# Patient Record
Sex: Female | Born: 1990
Health system: Southern US, Community
[De-identification: ages and names within clinical notes are randomized; demographics above are authoritative.]

## PROBLEM LIST (undated history)

## (undated) ENCOUNTER — Inpatient Hospital Stay (HOSPITAL_COMMUNITY): Payer: Self-pay

## (undated) DIAGNOSIS — R519 Headache, unspecified: Secondary | ICD-10-CM

## (undated) DIAGNOSIS — R87629 Unspecified abnormal cytological findings in specimens from vagina: Secondary | ICD-10-CM

## (undated) DIAGNOSIS — R51 Headache: Secondary | ICD-10-CM

## (undated) DIAGNOSIS — Z87410 Personal history of cervical dysplasia: Secondary | ICD-10-CM

## (undated) DIAGNOSIS — K219 Gastro-esophageal reflux disease without esophagitis: Secondary | ICD-10-CM

## (undated) DIAGNOSIS — T8339XA Other mechanical complication of intrauterine contraceptive device, initial encounter: Secondary | ICD-10-CM

## (undated) DIAGNOSIS — Z8619 Personal history of other infectious and parasitic diseases: Secondary | ICD-10-CM

## (undated) HISTORY — PX: LEEP: SHX91

## (undated) HISTORY — DX: Unspecified abnormal cytological findings in specimens from vagina: R87.629

## (undated) HISTORY — DX: Headache: R51

## (undated) HISTORY — DX: Headache, unspecified: R51.9

---

## 2012-01-25 ENCOUNTER — Encounter (HOSPITAL_COMMUNITY): Payer: Self-pay | Admitting: Emergency Medicine

## 2012-01-25 ENCOUNTER — Emergency Department (HOSPITAL_COMMUNITY)
Admission: EM | Admit: 2012-01-25 | Discharge: 2012-01-26 | Disposition: A | Discharge status: Home / Self Care | Payer: Self-pay | Attending: Emergency Medicine | Admitting: Emergency Medicine

## 2012-01-25 DIAGNOSIS — R110 Nausea: Secondary | ICD-10-CM | POA: Insufficient documentation

## 2012-01-25 DIAGNOSIS — R109 Unspecified abdominal pain: Secondary | ICD-10-CM | POA: Insufficient documentation

## 2012-01-25 DIAGNOSIS — N898 Other specified noninflammatory disorders of vagina: Secondary | ICD-10-CM | POA: Insufficient documentation

## 2012-01-25 DIAGNOSIS — N939 Abnormal uterine and vaginal bleeding, unspecified: Secondary | ICD-10-CM

## 2012-01-25 DIAGNOSIS — Z3202 Encounter for pregnancy test, result negative: Secondary | ICD-10-CM | POA: Insufficient documentation

## 2012-01-25 DIAGNOSIS — N760 Acute vaginitis: Secondary | ICD-10-CM

## 2012-01-25 LAB — POCT PREGNANCY, URINE: Preg Test, Ur: NEGATIVE

## 2012-01-25 LAB — CBC WITH DIFFERENTIAL/PLATELET
Basophils Absolute: 0 10*3/uL (ref 0.0–0.1)
Basophils Relative: 0 % (ref 0–1)
Eosinophils Absolute: 0.1 10*3/uL (ref 0.0–0.7)
MCH: 30 pg (ref 26.0–34.0)
MCHC: 33.7 g/dL (ref 30.0–36.0)
Monocytes Absolute: 0.5 10*3/uL (ref 0.1–1.0)
Monocytes Relative: 8 % (ref 3–12)
Neutro Abs: 4.3 10*3/uL (ref 1.7–7.7)
Neutrophils Relative %: 65 % (ref 43–77)
RDW: 12.6 % (ref 11.5–15.5)

## 2012-01-25 LAB — WET PREP, GENITAL: Yeast Wet Prep HPF POC: NONE SEEN

## 2012-01-25 LAB — BASIC METABOLIC PANEL
BUN: 15 mg/dL (ref 6–23)
Chloride: 100 mEq/L (ref 96–112)
Creatinine, Ser: 0.75 mg/dL (ref 0.50–1.10)
GFR calc Af Amer: >90 mL/min (ref >90)
GFR calc non Af Amer: >90 mL/min (ref >90)
Potassium: 3.7 mEq/L (ref 3.5–5.1)

## 2012-01-25 LAB — URINALYSIS, ROUTINE W REFLEX MICROSCOPIC
Ketones, ur: 15 mg/dL — AB
Leukocytes, UA: NEGATIVE
Nitrite: NEGATIVE
Protein, ur: NEGATIVE mg/dL
Urobilinogen, UA: 1 mg/dL (ref 0.0–1.0)

## 2012-01-25 LAB — HCG, SERUM, QUALITATIVE: Preg, Serum: NEGATIVE

## 2012-01-25 NOTE — ED Notes (Addendum)
Pt presents with vaginal bleeding for 2 days, describes it as reddish brown in color.  Pt states she took 3 positive pregnancy tests at home last week.  Pt also complaining of lower back pain, denies any abdominal tenderness or pain.  Pt in NAD at this time.  Urine collected for testing.

## 2012-01-25 NOTE — ED Provider Notes (Signed)
History     CSN: [id number]  Arrival date & time 01/25/12  2026   First MD Initiated Contact with Patient 01/25/12 2039      Chief Complaint  Patient presents with  . Vaginal Bleeding  . Possible Pregnancy    (Consider location/radiation/quality/duration/timing/severity/associated sxs/prior treatment) HPI Comments: 21 year old female presents to the emergency department with chief complaint of vaginal bleeding.  Patient states that last menstrual period was November 15.  Friday patient began having vaginal bleeding. She states it was heavy with lots of clotting.  This is abnormal for her normal menstrual periods.  One week ago patient was having some nausea and fatigue and took a home pregnancy test that was positive.  She repeated the home pregnancy test 2 more times.  Patient has an older child and states that she had lots of bleeding through the first trimester of her first pregnancy.  She is afraid that she may have had a miscarriage today.  She also complains of dyspareunia but denies any other vaginal symptoms.  Yesterday she had severe lower back pain and abdominal cramping.   Denies fevers, chills, myalgias, arthralgias. Denies DOE, SOB, chest tightness or pressure, radiation to left arm, jaw or back, or diaphoresis. Denies dysuria, flank pain, suprapubic pain, frequency, urgency, or hematuria. Denies headaches, light headedness, weakness, visual disturbances. Denies abdominal pain, nausea, vomiting, diarrhea or constipation.    Patient is a 21 y.o. female presenting with vaginal bleeding. The history is provided by the patient. No language interpreter was used.  Vaginal Bleeding This is a new problem. The current episode started in the past 7 days. The problem occurs constantly. The problem has been gradually worsening. Associated symptoms include abdominal pain and nausea. Pertinent negatives include no anorexia, arthralgias, change in bowel habit, chest pain, chills, congestion,  coughing, diaphoresis, fatigue, fever, headaches, joint swelling, myalgias, neck pain, numbness, rash, sore throat, swollen glands, urinary symptoms, vertigo, visual change, vomiting or weakness.    History reviewed. No pertinent past medical history.  History reviewed. No pertinent past surgical history.  History reviewed. No pertinent family history.  History  Substance Use Topics  . Smoking status: Never Smoker   . Smokeless tobacco: Not on file  . Alcohol Use: No    OB History    Grav Para Term Preterm Abortions TAB SAB Ect Mult Living                  Review of Systems  Constitutional: Negative for fever, chills, diaphoresis and fatigue.  HENT: Negative for congestion, sore throat and neck pain.   Eyes: Negative.   Respiratory: Negative for cough.   Cardiovascular: Negative for chest pain.  Gastrointestinal: Positive for nausea and abdominal pain. Negative for vomiting, anorexia and change in bowel habit.  Genitourinary: Positive for vaginal bleeding.  Musculoskeletal: Negative for myalgias, joint swelling and arthralgias.  Skin: Negative for rash.  Neurological: Negative for vertigo, weakness, numbness and headaches.  Psychiatric/Behavioral: Negative.     Allergies  Review of patient's allergies indicates no known allergies.  Home Medications   Current Outpatient Rx  Name  Route  Sig  Dispense  Refill  . PRENATAL MULTIVITAMIN CH   Oral   Take 1 tablet by mouth 2 (two) times daily.           BP 125/71  Pulse 70  Temp 97.4 F (36.3 C) (Oral)  Resp 16  SpO2 100%  LMP 01/04/2012  Physical Exam  Constitutional: She is oriented to person, place,  and time. She appears well-developed and well-nourished. No distress.  HENT:  Head: Normocephalic and atraumatic.  Eyes: Conjunctivae normal are normal. No scleral icterus.  Neck: Normal range of motion.  Cardiovascular: Normal rate, regular rhythm and normal heart sounds.  Exam reveals no gallop and no  friction rub.   No murmur heard. Pulmonary/Chest: Effort normal and breath sounds normal. No respiratory distress.  Abdominal: Soft. Bowel sounds are normal. She exhibits no distension and no mass. There is no tenderness. There is no guarding.  Neurological: She is alert and oriented to person, place, and time.  Skin: Skin is warm and dry. She is not diaphoretic.    ED Course  Procedures (including critical care time)  Labs Reviewed  URINALYSIS, ROUTINE W REFLEX MICROSCOPIC - Abnormal; Notable for the following:    APPearance CLOUDY (*)     Hgb urine dipstick LARGE (*)     Ketones, ur 15 (*)     All other components within normal limits  URINE MICROSCOPIC-ADD ON - Abnormal; Notable for the following:    Squamous Epithelial / LPF FEW (*)     Bacteria, UA FEW (*)     All other components within normal limits  POCT PREGNANCY, URINE  GC/CHLAMYDIA PROBE AMP  WET PREP, GENITAL  CBC WITH DIFFERENTIAL  BASIC METABOLIC PANEL  HCG, SERUM, QUALITATIVE   No results found.   No diagnosis found.    MDM  BP 125/71  Pulse 70  Temp 97.4 F (36.3 C) (Oral)  Resp 16  SpO2 100%  LMP 01/04/2012 Urine pregnancy is negative.  I will confirm with qualitative Beta HCG.    patien with Right adnexal pain on exam.  She is awaiting [place] to r/o torsion . Fredia Beets given report to PA Dammen who will assume care of the patient   Arthor Captain, PA-C 01/26/12 0130

## 2012-01-25 NOTE — ED Notes (Signed)
Pelvic exam completed by Arthor Captain, PA

## 2012-01-25 NOTE — ED Notes (Signed)
Pt presents with c/o vaginal bleeding over the past 4 days.  States she uses 4 peripads per day.  Denies vaginal pain  But c/o 7/10 lower back pain.  States she has had 3 positive pregnancy tests at home.  LMP 15Nov 13.  States nausea but no vomiting..  Awaiting pelvic exam

## 2012-01-25 NOTE — ED Notes (Signed)
Patient states she has been having vaginal bleeding since Friday, bright red in nature, now dark red/brown in nature. Took a pregnancy test at home, which was positive.  Patient does have some nausea.  LMP: November (middle of month)

## 2012-01-26 ENCOUNTER — Emergency Department (HOSPITAL_COMMUNITY): Payer: Self-pay

## 2012-01-26 LAB — GC/CHLAMYDIA PROBE AMP
CT Probe RNA: NEGATIVE
GC Probe RNA: NEGATIVE

## 2012-01-26 MED ORDER — IBUPROFEN 800 MG PO TABS
800.0000 mg | ORAL_TABLET | Freq: Once | ORAL | Status: AC
  Administered 2012-01-26: 800 mg via ORAL
  Filled 2012-01-26: qty 1

## 2012-01-26 MED ORDER — METRONIDAZOLE 500 MG PO TABS: 500.0000 mg | ORAL_TABLET | Freq: Two times a day (BID) | ORAL | Status: DC

## 2012-01-26 NOTE — ED Notes (Signed)
PT c/o pain 9/10.  Dr Estell Harpin notified and stated to give ibuprofen.

## 2012-01-26 NOTE — ED Notes (Signed)
Pt back from [place].  Speaking with Dr Estell Harpin.

## 2012-01-26 NOTE — ED Provider Notes (Signed)
Medical screening examination/treatment/procedure(s) were performed by non-physician practitioner and as supervising physician I was immediately available for consultation/collaboration.  Derwood Kaplan, MD 01/26/12 [phone]

## 2012-03-11 ENCOUNTER — Ambulatory Visit: Payer: Self-pay

## 2012-11-18 ENCOUNTER — Other Ambulatory Visit (HOSPITAL_COMMUNITY): Payer: Self-pay | Admitting: Nurse Practitioner

## 2012-11-18 DIAGNOSIS — Z0489 Encounter for examination and observation for other specified reasons: Secondary | ICD-10-CM

## 2012-11-18 LAB — OB RESULTS CONSOLE GC/CHLAMYDIA
CHLAMYDIA, DNA PROBE: NEGATIVE
GC PROBE AMP, GENITAL: NEGATIVE

## 2012-11-18 LAB — OB RESULTS CONSOLE HIV ANTIBODY (ROUTINE TESTING): HIV: NONREACTIVE

## 2012-11-18 LAB — OB RESULTS CONSOLE ABO/RH: RH Type: POSITIVE

## 2012-11-18 LAB — OB RESULTS CONSOLE ANTIBODY SCREEN: Antibody Screen: NEGATIVE

## 2012-11-18 LAB — OB RESULTS CONSOLE HEPATITIS B SURFACE ANTIGEN: Hepatitis B Surface Ag: NEGATIVE

## 2012-11-18 LAB — OB RESULTS CONSOLE RPR: RPR: NONREACTIVE

## 2012-12-08 ENCOUNTER — Ambulatory Visit (HOSPITAL_COMMUNITY)
Admission: RE | Admit: 2012-12-08 | Discharge: 2012-12-08 | Disposition: A | Discharge status: Home / Self Care | Payer: Medicaid Other | Source: Ambulatory Visit | Attending: Nurse Practitioner | Admitting: Nurse Practitioner

## 2012-12-08 ENCOUNTER — Other Ambulatory Visit (HOSPITAL_COMMUNITY): Payer: Self-pay | Admitting: Nurse Practitioner

## 2012-12-08 DIAGNOSIS — Z0489 Encounter for examination and observation for other specified reasons: Secondary | ICD-10-CM

## 2012-12-08 DIAGNOSIS — Z3689 Encounter for other specified antenatal screening: Secondary | ICD-10-CM | POA: Insufficient documentation

## 2012-12-08 DIAGNOSIS — O34219 Maternal care for unspecified type scar from previous cesarean delivery: Secondary | ICD-10-CM | POA: Insufficient documentation

## 2012-12-30 ENCOUNTER — Other Ambulatory Visit (HOSPITAL_COMMUNITY): Payer: Self-pay | Admitting: Nurse Practitioner

## 2012-12-30 DIAGNOSIS — Z0489 Encounter for examination and observation for other specified reasons: Secondary | ICD-10-CM

## 2013-01-05 ENCOUNTER — Other Ambulatory Visit (HOSPITAL_COMMUNITY): Payer: Self-pay | Admitting: Nurse Practitioner

## 2013-01-05 ENCOUNTER — Ambulatory Visit (HOSPITAL_COMMUNITY)
Admission: RE | Admit: 2013-01-05 | Discharge: 2013-01-05 | Disposition: A | Discharge status: Home / Self Care | Payer: Medicaid Other | Source: Ambulatory Visit | Attending: Nurse Practitioner | Admitting: Nurse Practitioner

## 2013-01-05 ENCOUNTER — Ambulatory Visit (HOSPITAL_COMMUNITY): Admission: RE | Admit: 2013-01-05 | Payer: Medicaid Other | Source: Ambulatory Visit

## 2013-01-05 DIAGNOSIS — Z3689 Encounter for other specified antenatal screening: Secondary | ICD-10-CM | POA: Insufficient documentation

## 2013-01-05 DIAGNOSIS — Z0489 Encounter for examination and observation for other specified reasons: Secondary | ICD-10-CM

## 2013-01-05 DIAGNOSIS — O34219 Maternal care for unspecified type scar from previous cesarean delivery: Secondary | ICD-10-CM | POA: Insufficient documentation

## 2013-01-07 ENCOUNTER — Other Ambulatory Visit (HOSPITAL_COMMUNITY): Payer: Self-pay | Admitting: Nurse Practitioner

## 2013-01-07 DIAGNOSIS — O3430 Maternal care for cervical incompetence, unspecified trimester: Secondary | ICD-10-CM

## 2013-01-21 ENCOUNTER — Encounter (HOSPITAL_COMMUNITY): Payer: Self-pay

## 2013-01-21 ENCOUNTER — Ambulatory Visit (HOSPITAL_COMMUNITY)
Admission: RE | Admit: 2013-01-21 | Discharge: 2013-01-21 | Disposition: A | Discharge status: Home / Self Care | Payer: Medicaid Other | Source: Ambulatory Visit | Attending: Nurse Practitioner | Admitting: Nurse Practitioner

## 2013-01-21 DIAGNOSIS — O34219 Maternal care for unspecified type scar from previous cesarean delivery: Secondary | ICD-10-CM | POA: Insufficient documentation

## 2013-01-21 DIAGNOSIS — O26879 Cervical shortening, unspecified trimester: Secondary | ICD-10-CM | POA: Insufficient documentation

## 2013-01-21 DIAGNOSIS — O3430 Maternal care for cervical incompetence, unspecified trimester: Secondary | ICD-10-CM

## 2013-02-03 ENCOUNTER — Other Ambulatory Visit (HOSPITAL_COMMUNITY): Payer: Self-pay | Admitting: Nurse Practitioner

## 2013-02-03 DIAGNOSIS — O26879 Cervical shortening, unspecified trimester: Secondary | ICD-10-CM

## 2013-02-04 ENCOUNTER — Other Ambulatory Visit (HOSPITAL_COMMUNITY): Payer: Self-pay | Admitting: Nurse Practitioner

## 2013-02-04 ENCOUNTER — Ambulatory Visit (HOSPITAL_COMMUNITY)
Admission: RE | Admit: 2013-02-04 | Discharge: 2013-02-04 | Disposition: A | Discharge status: Home / Self Care | Payer: Medicaid Other | Source: Ambulatory Visit | Attending: Nurse Practitioner | Admitting: Nurse Practitioner

## 2013-02-04 DIAGNOSIS — O26879 Cervical shortening, unspecified trimester: Secondary | ICD-10-CM | POA: Insufficient documentation

## 2013-02-04 DIAGNOSIS — O34219 Maternal care for unspecified type scar from previous cesarean delivery: Secondary | ICD-10-CM | POA: Insufficient documentation

## 2013-02-09 ENCOUNTER — Encounter (HOSPITAL_COMMUNITY): Payer: Self-pay | Admitting: General Practice

## 2013-02-09 ENCOUNTER — Inpatient Hospital Stay (HOSPITAL_COMMUNITY)
Admission: AD | Admit: 2013-02-09 | Discharge: 2013-02-09 | Disposition: A | Discharge status: Home / Self Care | Payer: Medicaid Other | Source: Ambulatory Visit | Attending: Obstetrics & Gynecology | Admitting: Obstetrics & Gynecology

## 2013-02-09 DIAGNOSIS — N939 Abnormal uterine and vaginal bleeding, unspecified: Secondary | ICD-10-CM

## 2013-02-09 DIAGNOSIS — R319 Hematuria, unspecified: Secondary | ICD-10-CM

## 2013-02-09 DIAGNOSIS — O26879 Cervical shortening, unspecified trimester: Secondary | ICD-10-CM | POA: Insufficient documentation

## 2013-02-09 DIAGNOSIS — O4690 Antepartum hemorrhage, unspecified, unspecified trimester: Secondary | ICD-10-CM | POA: Insufficient documentation

## 2013-02-09 LAB — URINALYSIS, ROUTINE W REFLEX MICROSCOPIC
Glucose, UA: NEGATIVE mg/dL
Ketones, ur: NEGATIVE mg/dL
Protein, ur: 30 mg/dL — AB
Specific Gravity, Urine: 1.015 (ref 1.005–1.030)
pH: 6.5 (ref 5.0–8.0)

## 2013-02-09 LAB — URINE MICROSCOPIC-ADD ON

## 2013-02-09 LAB — WET PREP, GENITAL
Clue Cells Wet Prep HPF POC: NONE SEEN
Yeast Wet Prep HPF POC: NONE SEEN

## 2013-02-09 NOTE — MAU Provider Note (Signed)
Chief Complaint:  Vaginal Bleeding   Jane Soto is a 22 y.o.  G3P1011 with IUP at [redacted]w[redacted]d presenting for Vaginal Bleeding  Patient states that she noticed some pink tinge when she wiped earlier today. Throughout the day, she has noticed more bright red every time she urinates. No bleeding on her underwear, nothing on the pads that she put on.  Urine looks like watered down koolaid. No ctx, lof. +FM. Has had a significant increase in urinary frequency. No dysuria, fevers, back pain.  No recent intercourse or vaginal exams.   Care at the HD.  Complicated by a shortened cervix.  Cervix 2.5cm on 12/22.  Following it with serial cervical lengths.   Menstrual History: OB History   Grav Para Term Preterm Abortions TAB SAB Ect Mult Living   3 1 1  0 1 0 1 0 0 1       Patient's last menstrual period was 07/31/2012.      History reviewed. No pertinent past medical history.  Past Surgical History  Procedure Laterality Date  . Cesarean section      History reviewed. No pertinent family history.  History  Substance Use Topics  . Smoking status: Never Smoker   . Smokeless tobacco: Not on file  . Alcohol Use: No     No Known Allergies  Prescriptions prior to admission  Medication Sig Dispense Refill  . Prenatal Vit-Fe Fumarate-FA (PRENATAL MULTIVITAMIN) TABS Take 1 tablet by mouth daily.         Review of Systems - Negative except for what is mentioned in HPI.  Physical Exam  Blood pressure 121/74, pulse 81, temperature 98.2 F (36.8 C), temperature source Oral, resp. rate 18, height 5\' 2"  (1.575 m), weight 54.885 kg (121 lb), last menstrual period 07/31/2012. GENERAL: Well-developed, well-nourished female in no acute distress.  LUNGS: Clear to auscultation bilaterally.  HEART: Regular rate and rhythm. ABDOMEN: Soft, nontender, nondistended, gravid.  EXTREMITIES: Nontender, no edema, 2+ distal pulses.  SSE: normal external genitalia, normal vagina, cervix, uterus and adnexa.  Small trickle of blood at the cervical os. Vaginal discharge physiologic and white.  No obvious urethral irritation.  Cervical Exam: Dilatation 0cm   Effacement thick%   Station high    FHT:  Baseline rate 150 bpm   Variability moderate  Accelerations present   Decelerations none Contractions: quiet   Labs: Results for orders placed during the hospital encounter of 02/09/13 (from the past 24 hour(s))  URINALYSIS, ROUTINE W REFLEX MICROSCOPIC   Collection Time    02/09/13  3:55 PM      Result Value Range   Color, Urine RED (*) YELLOW   APPearance CLOUDY (*) CLEAR   Specific Gravity, Urine 1.015  1.005 - 1.030   pH 6.5  5.0 - 8.0   Glucose, UA NEGATIVE  NEGATIVE mg/dL   Hgb urine dipstick LARGE (*) NEGATIVE   Bilirubin Urine NEGATIVE  NEGATIVE   Ketones, ur NEGATIVE  NEGATIVE mg/dL   Protein, ur 30 (*) NEGATIVE mg/dL   Urobilinogen, UA 0.2  0.0 - 1.0 mg/dL   Nitrite NEGATIVE  NEGATIVE   Leukocytes, UA TRACE (*) NEGATIVE  URINE MICROSCOPIC-ADD ON   Collection Time    02/09/13  3:55 PM      Result Value Range   Squamous Epithelial / LPF RARE  RARE   WBC, UA 0-2  <3 WBC/hpf   RBC / HPF TOO NUMEROUS TO COUNT  <3 RBC/hpf   Bacteria, UA FEW (*) RARE  Imaging Studies:     Assessment: Jane Soto is  22 y.o. G3P1011 at [redacted]w[redacted]d presents with Vaginal Bleeding .unclear if coming from the urinary tract vs. Vagina although I favor urinary tract in the setting of increased frequency and very minimal bleeding on speculum exam.   Plan: - will send off urine cx and treat if + - cervix long and closed as expected. No further intervention at this point - bleeding precautions discussed including if her bleeding worsens or she develops cramping pain.  - otherwise, f/u in clinic as scheduled.   Care and plan discussed with Dr. Laurel Dimmer, Nyashia Raney L 12/24/20144:43 PM

## 2013-02-09 NOTE — MAU Note (Signed)
Back ache last night.  Spotting this morning, now more of bright red bleeding, no real pain just pressure.  Was told "short cervix"

## 2013-02-09 NOTE — MAU Note (Signed)
Pt states she's had bright red vaginal bleeding today starting around 10:00 this am.  Pt states that bleeding was pinkish this AM but throughout the day it has gotten worse and become red rather than pink. Pt states the blood is in the toilet after she use the bathroom and she has not seen any blood in her underwear.

## 2013-02-10 LAB — CULTURE, OB URINE
Colony Count: NO GROWTH
Culture: NO GROWTH
Special Requests: NORMAL

## 2013-02-10 LAB — GC/CHLAMYDIA PROBE AMP: GC Probe RNA: NEGATIVE

## 2013-02-11 NOTE — MAU Provider Note (Signed)
Attestation of Attending Supervision of Fellow: Evaluation and management procedures were performed by the Fellow under my supervision and collaboration.  I have reviewed the Fellow's note and chart, and I agree with the management and plan.    

## 2013-02-18 ENCOUNTER — Other Ambulatory Visit (HOSPITAL_COMMUNITY): Payer: Self-pay | Admitting: Nurse Practitioner

## 2013-02-18 ENCOUNTER — Ambulatory Visit (HOSPITAL_COMMUNITY): Payer: Medicaid Other

## 2013-02-18 DIAGNOSIS — O26879 Cervical shortening, unspecified trimester: Secondary | ICD-10-CM

## 2013-02-21 ENCOUNTER — Ambulatory Visit (HOSPITAL_COMMUNITY)
Admission: RE | Admit: 2013-02-21 | Discharge: 2013-02-21 | Disposition: A | Discharge status: Home / Self Care | Payer: Medicaid Other | Source: Ambulatory Visit | Attending: Obstetrics & Gynecology | Admitting: Obstetrics & Gynecology

## 2013-02-21 DIAGNOSIS — O26879 Cervical shortening, unspecified trimester: Secondary | ICD-10-CM | POA: Insufficient documentation

## 2013-02-21 DIAGNOSIS — O34219 Maternal care for unspecified type scar from previous cesarean delivery: Secondary | ICD-10-CM | POA: Insufficient documentation

## 2013-04-20 ENCOUNTER — Encounter (HOSPITAL_COMMUNITY): Payer: Self-pay | Admitting: Pharmacist

## 2013-04-28 NOTE — Patient Instructions (Signed)
Your procedure is scheduled on: Monday, May 02, 2013  [facility] 1200[facility]  Pick up the phone at the desk and dial [phone].  Call this number if you have problems the morning of surgery: [phone].  Remember: Do NOT eat food: AFTER MIDNIGHT SUNDAY Do NOT drink clear liquids after: AFTER 9:30 AM DAY OF SURGERY Take these medicines the morning of surgery with a SIP OF WATER: NONE  Do NOT wear jewelry (body piercing), make-up, or nail polish. Do NOT wear lotions, powders, or perfumes.  You may wear deoderant. Do NOT shave for 48 hours prior to surgery. Do NOT bring valuables to the hospital. Contacts, dentures, or bridgework may not be worn into surgery. Leave suitcase in car.  After surgery it may be brought to your room.  For patients admitted to the hospital, checkout time is 11:00 AM the day of discharge.

## 2013-04-29 ENCOUNTER — Encounter (HOSPITAL_COMMUNITY)
Admission: RE | Admit: 2013-04-29 | Discharge: 2013-04-29 | Disposition: A | Discharge status: Home / Self Care | Payer: Medicaid Other | Source: Ambulatory Visit | Attending: Obstetrics and Gynecology | Admitting: Obstetrics and Gynecology

## 2013-04-29 ENCOUNTER — Other Ambulatory Visit: Payer: Self-pay | Admitting: Obstetrics and Gynecology

## 2013-04-29 ENCOUNTER — Encounter (HOSPITAL_COMMUNITY): Payer: Self-pay

## 2013-04-29 DIAGNOSIS — Z01812 Encounter for preprocedural laboratory examination: Secondary | ICD-10-CM | POA: Insufficient documentation

## 2013-04-29 HISTORY — DX: Gastro-esophageal reflux disease without esophagitis: K21.9

## 2013-04-29 LAB — TYPE AND SCREEN
ABO/RH(D): B POS
Antibody Screen: NEGATIVE

## 2013-04-29 LAB — CBC
HEMATOCRIT: 34.1 % — AB (ref 36.0–46.0)
Hemoglobin: 11.5 g/dL — ABNORMAL LOW (ref 12.0–15.0)
MCH: 30.3 pg (ref 26.0–34.0)
MCHC: 33.7 g/dL (ref 30.0–36.0)
MCV: 89.7 fL (ref 78.0–100.0)
Platelets: 130 10*3/uL — ABNORMAL LOW (ref 150–400)
RBC: 3.8 MIL/uL — ABNORMAL LOW (ref 3.87–5.11)
RDW: 13.5 % (ref 11.5–15.5)
WBC: 8 10*3/uL (ref 4.0–10.5)

## 2013-04-29 LAB — ABO/RH: ABO/RH(D): B POS

## 2013-04-29 NOTE — Pre-Procedure Instructions (Signed)
Dr. Rodman Pickleassidy made aware of low platelets she ordered repeat CBC day of surgery.

## 2013-04-30 LAB — RPR: RPR Ser Ql: NONREACTIVE

## 2013-05-02 ENCOUNTER — Encounter (HOSPITAL_COMMUNITY): Payer: Self-pay | Admitting: *Deleted

## 2013-05-02 ENCOUNTER — Inpatient Hospital Stay (HOSPITAL_COMMUNITY)
Admission: RE | Admit: 2013-05-02 | Discharge: 2013-05-05 | DRG: 765 | Disposition: A | Discharge status: Home / Self Care | Payer: Medicaid Other | Source: Ambulatory Visit | Attending: Obstetrics & Gynecology | Admitting: Obstetrics & Gynecology

## 2013-05-02 ENCOUNTER — Encounter (HOSPITAL_COMMUNITY): Payer: Medicaid Other | Admitting: Anesthesiology

## 2013-05-02 ENCOUNTER — Inpatient Hospital Stay (HOSPITAL_COMMUNITY): Payer: Medicaid Other | Admitting: Anesthesiology

## 2013-05-02 ENCOUNTER — Encounter (HOSPITAL_COMMUNITY)
Admission: RE | Disposition: A | Discharge status: Home / Self Care | Payer: Self-pay | Source: Ambulatory Visit | Attending: Obstetrics & Gynecology

## 2013-05-02 DIAGNOSIS — K219 Gastro-esophageal reflux disease without esophagitis: Secondary | ICD-10-CM

## 2013-05-02 DIAGNOSIS — O34219 Maternal care for unspecified type scar from previous cesarean delivery: Secondary | ICD-10-CM

## 2013-05-02 DIAGNOSIS — O26879 Cervical shortening, unspecified trimester: Secondary | ICD-10-CM | POA: Diagnosis present

## 2013-05-02 LAB — CBC
HCT: 34.7 % — ABNORMAL LOW (ref 36.0–46.0)
HEMOGLOBIN: 12 g/dL (ref 12.0–15.0)
MCH: 30.8 pg (ref 26.0–34.0)
MCHC: 34.6 g/dL (ref 30.0–36.0)
MCV: 89.2 fL (ref 78.0–100.0)
Platelets: 136 10*3/uL — ABNORMAL LOW (ref 150–400)
RBC: 3.89 MIL/uL (ref 3.87–5.11)
RDW: 13.6 % (ref 11.5–15.5)
WBC: 8.5 10*3/uL (ref 4.0–10.5)

## 2013-05-02 SURGERY — Surgical Case
Anesthesia: Spinal | Site: Abdomen

## 2013-05-02 MED ORDER — SIMETHICONE 80 MG PO CHEW
80.0000 mg | CHEWABLE_TABLET | ORAL | Status: DC
  Administered 2013-05-03 – 2013-05-04 (×3): 80 mg via ORAL
  Filled 2013-05-02 (×3): qty 1

## 2013-05-02 MED ORDER — DIPHENHYDRAMINE HCL 50 MG/ML IJ SOLN: 25.0000 mg | INTRAMUSCULAR | Status: DC | PRN

## 2013-05-02 MED ORDER — PHENYLEPHRINE 8 MG IN D5W 100 ML (0.08MG/ML) PREMIX OPTIME
INJECTION | INTRAVENOUS | Status: AC
  Filled 2013-05-02: qty 100

## 2013-05-02 MED ORDER — SENNOSIDES-DOCUSATE SODIUM 8.6-50 MG PO TABS
2.0000 | ORAL_TABLET | ORAL | Status: DC
  Administered 2013-05-03 – 2013-05-04 (×3): 2 via ORAL
  Filled 2013-05-02 (×3): qty 2

## 2013-05-02 MED ORDER — SCOPOLAMINE 1 MG/3DAYS TD PT72: 1.0000 | MEDICATED_PATCH | Freq: Once | TRANSDERMAL | Status: DC

## 2013-05-02 MED ORDER — NALBUPHINE HCL 10 MG/ML IJ SOLN
5.0000 mg | INTRAMUSCULAR | Status: DC | PRN
  Administered 2013-05-02: 10 mg via SUBCUTANEOUS

## 2013-05-02 MED ORDER — PRENATAL MULTIVITAMIN CH
1.0000 | ORAL_TABLET | Freq: Every day | ORAL | Status: DC
  Administered 2013-05-03 – 2013-05-05 (×3): 1 via ORAL
  Filled 2013-05-02 (×3): qty 1

## 2013-05-02 MED ORDER — CEFAZOLIN SODIUM-DEXTROSE 2-3 GM-% IV SOLR
INTRAVENOUS | Status: AC
  Filled 2013-05-02: qty 50

## 2013-05-02 MED ORDER — PROMETHAZINE HCL 25 MG/ML IJ SOLN: 6.2500 mg | INTRAMUSCULAR | Status: DC | PRN

## 2013-05-02 MED ORDER — DIPHENHYDRAMINE HCL 50 MG/ML IJ SOLN: 12.5000 mg | INTRAMUSCULAR | Status: DC | PRN

## 2013-05-02 MED ORDER — MORPHINE SULFATE 0.5 MG/ML IJ SOLN
INTRAMUSCULAR | Status: AC
  Filled 2013-05-02: qty 10

## 2013-05-02 MED ORDER — LANOLIN HYDROUS EX OINT: 1.0000 "application " | TOPICAL_OINTMENT | CUTANEOUS | Status: DC | PRN

## 2013-05-02 MED ORDER — DIPHENHYDRAMINE HCL 25 MG PO CAPS: 25.0000 mg | ORAL_CAPSULE | ORAL | Status: DC | PRN

## 2013-05-02 MED ORDER — DIBUCAINE 1 % RE OINT: 1.0000 "application " | TOPICAL_OINTMENT | RECTAL | Status: DC | PRN

## 2013-05-02 MED ORDER — NALBUPHINE HCL 10 MG/ML IJ SOLN: 5.0000 mg | INTRAMUSCULAR | Status: DC | PRN

## 2013-05-02 MED ORDER — NALOXONE HCL 0.4 MG/ML IJ SOLN: 0.4000 mg | INTRAMUSCULAR | Status: DC | PRN

## 2013-05-02 MED ORDER — KETOROLAC TROMETHAMINE 30 MG/ML IJ SOLN
INTRAMUSCULAR | Status: AC
Start: 2013-05-02 — End: 2013-05-03
  Filled 2013-05-02: qty 1

## 2013-05-02 MED ORDER — TETANUS-DIPHTH-ACELL PERTUSSIS 5-2.5-18.5 LF-MCG/0.5 IM SUSP: 0.5000 mL | Freq: Once | INTRAMUSCULAR | Status: DC

## 2013-05-02 MED ORDER — ONDANSETRON HCL 4 MG/2ML IJ SOLN: 4.0000 mg | Freq: Three times a day (TID) | INTRAMUSCULAR | Status: DC | PRN

## 2013-05-02 MED ORDER — MIDAZOLAM HCL 2 MG/2ML IJ SOLN: 0.5000 mg | Freq: Once | INTRAMUSCULAR | Status: DC | PRN

## 2013-05-02 MED ORDER — IBUPROFEN 600 MG PO TABS
600.0000 mg | ORAL_TABLET | Freq: Four times a day (QID) | ORAL | Status: DC
  Administered 2013-05-03 – 2013-05-05 (×9): 600 mg via ORAL
  Filled 2013-05-02 (×10): qty 1

## 2013-05-02 MED ORDER — FENTANYL CITRATE 0.05 MG/ML IJ SOLN: 25.0000 ug | INTRAMUSCULAR | Status: DC | PRN

## 2013-05-02 MED ORDER — MENTHOL 3 MG MT LOZG: 1.0000 | LOZENGE | OROMUCOSAL | Status: DC | PRN

## 2013-05-02 MED ORDER — MEPERIDINE HCL 25 MG/ML IJ SOLN: 6.2500 mg | INTRAMUSCULAR | Status: DC | PRN

## 2013-05-02 MED ORDER — MORPHINE SULFATE (PF) 0.5 MG/ML IJ SOLN
INTRAMUSCULAR | Status: DC | PRN
  Administered 2013-05-02: .15 mg via INTRATHECAL

## 2013-05-02 MED ORDER — OXYTOCIN 10 UNIT/ML IJ SOLN
INTRAMUSCULAR | Status: AC
Start: 2013-05-02 — End: 2013-05-02
  Filled 2013-05-02: qty 4

## 2013-05-02 MED ORDER — OXYCODONE-ACETAMINOPHEN 5-325 MG PO TABS
1.0000 | ORAL_TABLET | ORAL | Status: DC | PRN
  Administered 2013-05-03 (×2): 2 via ORAL
  Administered 2013-05-04 – 2013-05-05 (×4): 1 via ORAL
  Filled 2013-05-02: qty 1
  Filled 2013-05-02: qty 2
  Filled 2013-05-02: qty 1
  Filled 2013-05-02: qty 2
  Filled 2013-05-02: qty 1
  Filled 2013-05-02: qty 2
  Filled 2013-05-02: qty 1

## 2013-05-02 MED ORDER — WITCH HAZEL-GLYCERIN EX PADS: 1.0000 "application " | MEDICATED_PAD | CUTANEOUS | Status: DC | PRN

## 2013-05-02 MED ORDER — LACTATED RINGERS IV SOLN
INTRAVENOUS | Status: DC
  Administered 2013-05-02: 21:00:00 via INTRAVENOUS

## 2013-05-02 MED ORDER — FENTANYL CITRATE 0.05 MG/ML IJ SOLN
INTRAMUSCULAR | Status: AC
  Filled 2013-05-02: qty 2

## 2013-05-02 MED ORDER — SIMETHICONE 80 MG PO CHEW: 80.0000 mg | CHEWABLE_TABLET | ORAL | Status: DC | PRN

## 2013-05-02 MED ORDER — IBUPROFEN 600 MG PO TABS: 600.0000 mg | ORAL_TABLET | Freq: Four times a day (QID) | ORAL | Status: DC | PRN

## 2013-05-02 MED ORDER — OXYTOCIN 40 UNITS IN LACTATED RINGERS INFUSION - SIMPLE MED: 62.5000 mL/h | INTRAVENOUS | Status: AC

## 2013-05-02 MED ORDER — ONDANSETRON HCL 4 MG/2ML IJ SOLN: 4.0000 mg | INTRAMUSCULAR | Status: DC | PRN

## 2013-05-02 MED ORDER — ONDANSETRON HCL 4 MG/2ML IJ SOLN
INTRAMUSCULAR | Status: AC
  Filled 2013-05-02: qty 2

## 2013-05-02 MED ORDER — LACTATED RINGERS IV SOLN
INTRAVENOUS | Status: DC | PRN
  Administered 2013-05-02 (×2): via INTRAVENOUS

## 2013-05-02 MED ORDER — KETOROLAC TROMETHAMINE 30 MG/ML IJ SOLN
30.0000 mg | Freq: Four times a day (QID) | INTRAMUSCULAR | Status: AC | PRN
  Administered 2013-05-02: 30 mg via INTRAMUSCULAR
  Filled 2013-05-02: qty 1

## 2013-05-02 MED ORDER — FENTANYL CITRATE 0.05 MG/ML IJ SOLN
INTRAMUSCULAR | Status: DC | PRN
  Administered 2013-05-02: 25 ug via INTRATHECAL

## 2013-05-02 MED ORDER — SIMETHICONE 80 MG PO CHEW
80.0000 mg | CHEWABLE_TABLET | Freq: Three times a day (TID) | ORAL | Status: DC
  Administered 2013-05-02 – 2013-05-05 (×6): 80 mg via ORAL
  Filled 2013-05-02 (×7): qty 1

## 2013-05-02 MED ORDER — NALOXONE HCL 1 MG/ML IJ SOLN: 1.0000 ug/kg/h | INTRAVENOUS | Status: DC | PRN

## 2013-05-02 MED ORDER — ACETAMINOPHEN 500 MG PO TABS
1000.0000 mg | ORAL_TABLET | Freq: Four times a day (QID) | ORAL | Status: AC
Start: 2013-05-02 — End: 2013-05-03
  Administered 2013-05-02 – 2013-05-03 (×2): 1000 mg via ORAL
  Filled 2013-05-02 (×2): qty 2

## 2013-05-02 MED ORDER — ONDANSETRON HCL 4 MG/2ML IJ SOLN
INTRAMUSCULAR | Status: DC | PRN
Start: 2013-05-02 — End: 2013-05-02
  Administered 2013-05-02: 4 mg via INTRAVENOUS

## 2013-05-02 MED ORDER — PHENYLEPHRINE 8 MG IN D5W 100 ML (0.08MG/ML) PREMIX OPTIME
INJECTION | INTRAVENOUS | Status: DC | PRN
  Administered 2013-05-02: 60 ug/min via INTRAVENOUS

## 2013-05-02 MED ORDER — LACTATED RINGERS IV SOLN
INTRAVENOUS | Status: DC | PRN
  Administered 2013-05-02: 14:00:00 via INTRAVENOUS

## 2013-05-02 MED ORDER — NALBUPHINE HCL 10 MG/ML IJ SOLN
INTRAMUSCULAR | Status: AC
  Administered 2013-05-02: 10 mg via SUBCUTANEOUS
  Filled 2013-05-02: qty 1

## 2013-05-02 MED ORDER — CEFAZOLIN SODIUM-DEXTROSE 2-3 GM-% IV SOLR
2.0000 g | INTRAVENOUS | Status: AC
  Administered 2013-05-02: 2 g via INTRAVENOUS

## 2013-05-02 MED ORDER — SCOPOLAMINE 1 MG/3DAYS TD PT72
MEDICATED_PATCH | TRANSDERMAL | Status: AC
  Filled 2013-05-02: qty 1

## 2013-05-02 MED ORDER — BUPIVACAINE IN DEXTROSE 0.75-8.25 % IT SOLN
INTRATHECAL | Status: DC | PRN
  Administered 2013-05-02: 1.5 mL via INTRATHECAL

## 2013-05-02 MED ORDER — KETOROLAC TROMETHAMINE 30 MG/ML IJ SOLN
30.0000 mg | Freq: Four times a day (QID) | INTRAMUSCULAR | Status: AC | PRN
  Administered 2013-05-02: 30 mg via INTRAVENOUS

## 2013-05-02 MED ORDER — DIPHENHYDRAMINE HCL 25 MG PO CAPS: 25.0000 mg | ORAL_CAPSULE | Freq: Four times a day (QID) | ORAL | Status: DC | PRN

## 2013-05-02 MED ORDER — ONDANSETRON HCL 4 MG PO TABS: 4.0000 mg | ORAL_TABLET | ORAL | Status: DC | PRN

## 2013-05-02 MED ORDER — METOCLOPRAMIDE HCL 5 MG/ML IJ SOLN: 10.0000 mg | Freq: Three times a day (TID) | INTRAMUSCULAR | Status: DC | PRN

## 2013-05-02 MED ORDER — LACTATED RINGERS IV SOLN
40.0000 [IU] | INTRAVENOUS | Status: DC | PRN
  Administered 2013-05-02: 40 [IU] via INTRAVENOUS

## 2013-05-02 MED ORDER — SODIUM CHLORIDE 0.9 % IJ SOLN: 3.0000 mL | INTRAMUSCULAR | Status: DC | PRN

## 2013-05-02 MED ORDER — MEPERIDINE HCL 25 MG/ML IJ SOLN
6.2500 mg | INTRAMUSCULAR | Status: DC | PRN
Start: 2013-05-02 — End: 2013-05-05

## 2013-05-02 SURGICAL SUPPLY — 32 items
BENZOIN TINCTURE PRP APPL 2/3 (GAUZE/BANDAGES/DRESSINGS) ×3 IMPLANT
CLAMP CORD UMBIL (MISCELLANEOUS) IMPLANT
CLOSURE WOUND 1/2 X4 (GAUZE/BANDAGES/DRESSINGS) ×1
CONTAINER PREFILL 10% NBF 15ML (MISCELLANEOUS) IMPLANT
DRAPE LG THREE QUARTER DISP (DRAPES) IMPLANT
DRSG OPSITE POSTOP 4X10 (GAUZE/BANDAGES/DRESSINGS) ×3 IMPLANT
DURAPREP 26ML APPLICATOR (WOUND CARE) ×3 IMPLANT
ELECT REM PT RETURN 9FT ADLT (ELECTROSURGICAL) ×3
ELECTRODE REM PT RTRN 9FT ADLT (ELECTROSURGICAL) ×1 IMPLANT
EXTRACTOR VACUUM M CUP 4 TUBE (SUCTIONS) IMPLANT
EXTRACTOR VACUUM M CUP 4' TUBE (SUCTIONS)
GLOVE BIOGEL PI IND STRL 6.5 (GLOVE) ×1 IMPLANT
GLOVE BIOGEL PI INDICATOR 6.5 (GLOVE) ×2
GLOVE SURG SS PI 6.0 STRL IVOR (GLOVE) ×3 IMPLANT
GOWN STRL REUS W/TWL LRG LVL3 (GOWN DISPOSABLE) ×6 IMPLANT
KIT ABG SYR 3ML LUER SLIP (SYRINGE) IMPLANT
NEEDLE HYPO 25X5/8 SAFETYGLIDE (NEEDLE) IMPLANT
NS IRRIG 1000ML POUR BTL (IV SOLUTION) ×3 IMPLANT
PACK C SECTION WH (CUSTOM PROCEDURE TRAY) ×3 IMPLANT
PAD ABD 7.5X8 STRL (GAUZE/BANDAGES/DRESSINGS) ×3 IMPLANT
PAD OB MATERNITY 4.3X12.25 (PERSONAL CARE ITEMS) ×3 IMPLANT
RTRCTR C-SECT PINK 25CM LRG (MISCELLANEOUS) ×3 IMPLANT
SEPRAFILM MEMBRANE 5X6 (MISCELLANEOUS) IMPLANT
STAPLER VISISTAT 35W (STAPLE) IMPLANT
STRIP CLOSURE SKIN 1/2X4 (GAUZE/BANDAGES/DRESSINGS) ×2 IMPLANT
SUT PLAIN 0 NONE (SUTURE) IMPLANT
SUT VIC AB 0 CT1 36 (SUTURE) ×12 IMPLANT
SUT VIC AB 4-0 KS 27 (SUTURE) ×3 IMPLANT
TAPE CLOTH SURG 4X10 WHT LF (GAUZE/BANDAGES/DRESSINGS) ×3 IMPLANT
TOWEL OR 17X24 6PK STRL BLUE (TOWEL DISPOSABLE) ×3 IMPLANT
TRAY FOLEY CATH 14FR (SET/KITS/TRAYS/PACK) ×3 IMPLANT
WATER STERILE IRR 1000ML POUR (IV SOLUTION) ×3 IMPLANT

## 2013-05-02 NOTE — Lactation Note (Addendum)
This note was copied from the chart of Boy Jozelyn Delgado. Lactation Consultation Note Initial Consult:  ExperiencLennon Alstromed BF mother.  Baby boy 4 hours old. Breastfed older son for 6 months with a nipple shield.  Reviewed hand expression.   Assisted mother in football hold and cross cradle hold. Baby latched briefly but would not sustain latch.  Mother's nipples are semi flat. Provided hand pump and shells to help evert nipples.  Provided mother with a size #20 nipple shield and reviewed use, in case tonight he will not latch. Reviewed basics, lactation support services and brochure. Encouraged mother to call for further assistance. Patient Name: Boy [name] ZOXWR'UToday's Date: 05/02/2013 Reason for consult: Initial assessment   Maternal Data Infant to breast within first hour of birth: Yes Has patient been taught Hand Expression?: Yes Does the patient have breastfeeding experience prior to this delivery?: Yes  Feeding Feeding Type: Breast Fed Length of feed: 0 min (not interested at this time )  LATCH Score/Interventions Latch: Repeated attempts needed to sustain latch, nipple held in mouth throughout feeding, stimulation needed to elicit sucking reflex. Intervention(s): Waking techniques;Teach feeding cues Intervention(s): Breast compression;Adjust position;Assist with latch  Audible Swallowing: None Intervention(s): Skin to skin;Hand expression  Type of Nipple: Everted at rest and after stimulation (semi flat)  Comfort (Breast/Nipple): Soft / non-tender     Hold (Positioning): Assistance needed to correctly position infant at breast and maintain latch. Intervention(s): Breastfeeding basics reviewed;Support Pillows;Position options;Skin to skin  LATCH Score: 6  Lactation Tools Discussed/Used     Consult Status Consult Status: Follow-up Date: 05/03/13 Follow-up type: In-patient    Dahlia ByesBerkelhammer, [facility] 05/02/2013, 6:41 PM

## 2013-05-02 NOTE — Anesthesia Procedure Notes (Signed)
Spinal  Patient location during procedure: OR Start time: 05/02/2013 1:32 PM Staffing Anesthesiologist: Brayton CavesJACKSON, Crystall Donaldson Performed by: anesthesiologist  Preanesthetic Checklist Completed: patient identified, site marked, surgical consent, pre-op evaluation, timeout performed, IV checked, risks and benefits discussed and monitors and equipment checked Spinal Block Patient position: sitting Prep: DuraPrep Patient monitoring: heart rate, cardiac monitor, continuous pulse ox and blood pressure Approach: midline Location: L3-4 Injection technique: single-shot Needle Needle type: Sprotte  Needle gauge: 24 G Needle length: 9 cm Assessment Sensory level: T4 Additional Notes Patient identified.  Risk benefits discussed including failed block, incomplete pain control, headache, nerve damage, paralysis, blood pressure changes, nausea, vomiting, reactions to medication both toxic or allergic, and postpartum back pain.  Patient expressed understanding and wished to proceed.  All questions were answered.  Sterile technique used throughout procedure.  CSF was clear.  No parasthesia or other complications.  Please see nursing notes for vital signs.

## 2013-05-02 NOTE — Anesthesia Postprocedure Evaluation (Signed)
  Anesthesia Post Note  Patient: Jane Soto  Procedure(s) Performed: Procedure(s) (LRB): CESAREAN SECTION (N/A)  Anesthesia type: Spinal  Patient location: PACU  Post pain: Pain level controlled  Post assessment: Post-op Vital signs reviewed  Last Vitals:  Filed Vitals:   05/02/13 1500  BP: 106/71  Pulse: 60  Temp:   Resp: 20    Post vital signs: Reviewed  Level of consciousness: awake  Complications: No apparent anesthesia complications

## 2013-05-02 NOTE — Transfer of Care (Signed)
Immediate Anesthesia Transfer of Care Note  Patient: Jane Soto  Procedure(s) Performed: Procedure(s): CESAREAN SECTION (N/A)  Patient Location: PACU  Anesthesia Type:Spinal  Level of Consciousness: awake, alert , oriented and patient cooperative  Airway & Oxygen Therapy: Patient Spontanous Breathing  Post-op Assessment: Report given to PACU RN and Post -op Vital signs reviewed and stable  Post vital signs: Reviewed and stable  Complications: No apparent anesthesia complications

## 2013-05-02 NOTE — Op Note (Signed)
Jane Soto PROCEDURE DATE: 05/02/2013  PREOPERATIVE DIAGNOSIS: Intrauterine pregnancy at  4478w2d weeks gestation; for elective repeat cesarean section  POSTOPERATIVE DIAGNOSIS: The same  PROCEDURE:     Cesarean Section  SURGEON:  Dr. Catalina AntiguaPeggy Ronella Plunk  ASSISTANT: Dr. Dia CrawfordKelli Beck  INDICATIONS: Jane Soto is a 23 y.o. G3P1011 at 7278w2d scheduled for cesarean section secondary to elective repeat cesarean section.  The risks of cesarean section discussed with the patient included but were not limited to: bleeding which may require transfusion or reoperation; infection which may require antibiotics; injury to bowel, bladder, ureters or other surrounding organs; injury to the fetus; need for additional procedures including hysterectomy in the event of a life-threatening hemorrhage; placental abnormalities wth subsequent pregnancies, incisional problems, thromboembolic phenomenon and other postoperative/anesthesia complications. The patient concurred with the proposed plan, giving informed written consent for the procedure.    FINDINGS:  Viable female infant in cephalic presentation.  Apgars 9and 9.  Clear amniotic fluid.  Intact placenta, three vessel cord.  Normal uterus, fallopian tubes and ovaries bilaterally.  ANESTHESIA:    Spinal INTRAVENOUS FLUIDS:2200 ml ESTIMATED BLOOD LOSS: 500 ml URINE OUTPUT:  250 ml SPECIMENS: Placenta sent to L&D COMPLICATIONS: None immediate  PROCEDURE IN DETAIL:  The patient received intravenous antibiotics and had sequential compression devices applied to her lower extremities while in the preoperative area.  She was then taken to the operating room where anesthesia was induced and was found to be adequate. A foley catheter was placed into her bladder and attached to [name] gravity. She was then placed in a dorsal supine position with a leftward tilt, and prepped and draped in a sterile manner. After an adequate timeout was performed, a Pfannenstiel skin incision  was made with scalpel and carried through to the underlying layer of fascia. The fascia was incised in the midline and this incision was extended bilaterally using the Mayo scissors. Kocher clamps were applied to the superior aspect of the fascial incision and the underlying rectus muscles were dissected off bluntly. A similar process was carried out on the inferior aspect of the facial incision. The rectus muscles were separated in the midline bluntly and the peritoneum was entered bluntly. The Alexis self-retaining retractor was introduced into the abdominal cavity. Attention was turned to the lower uterine segment where a bladder flap was created, and a transverse hysterotomy was made with a scalpel and extended bilaterally bluntly. The infant was successfully delivered, and cord was clamped and cut and infant was handed over to awaiting neonatology team. Uterine massage was then administered and the placenta delivered intact with three-vessel cord. The uterus was cleared of clot and debris.  The hysterotomy was closed with 0 Vicryl in a running locked fashion, and an imbricating layer was also placed with a 0 Vicryl. Overall, excellent hemostasis was noted. The pelvis copiously irrigated and cleared of all clot and debris. Hemostasis was confirmed on all surfaces. The fascia was then closed using 0 Vicryl in a running locked fashion.  The subcutaneous layer was reapproximated with plain gut and the skin was closed in a subcuticular fashion using 3.0 Vicryl. The patient tolerated the procedure well. Sponge, lap, instrument and needle counts were correct x 2. She was taken to the recovery room in stable condition.    [name],PEGGYMD  05/02/2013 2:12 PM

## 2013-05-02 NOTE — Anesthesia Preprocedure Evaluation (Signed)
Anesthesia Evaluation  Patient identified by MRN, date of birth, ID band Patient awake    Reviewed: Allergy & Precautions, H&P , NPO status , Patient's Chart, lab work & pertinent test results  Airway Mallampati: II      Dental   Pulmonary  breath sounds clear to auscultation        Cardiovascular Exercise Tolerance: Good Rhythm:regular Rate:Normal     Neuro/Psych    GI/Hepatic GERD-  ,  Endo/Other    Renal/GU      Musculoskeletal   Abdominal   Peds  Hematology   Anesthesia Other Findings   Reproductive/Obstetrics (+) Pregnancy                           Anesthesia Physical Anesthesia Plan  ASA: II  Anesthesia Plan: Spinal   Post-op Pain Management:    Induction:   Airway Management Planned:   Additional Equipment:   Intra-op Plan:   Post-operative Plan:   Informed Consent: I have reviewed the patients History and Physical, chart, labs and discussed the procedure including the risks, benefits and alternatives for the proposed anesthesia with the patient or authorized representative who has indicated his/her understanding and acceptance.     Plan Discussed with: Anesthesiologist, CRNA and Surgeon  Anesthesia Plan Comments:         Anesthesia Quick Evaluation  

## 2013-05-02 NOTE — H&P (Signed)
Jane Soto is a 23 y.o. female G3P1011 at 2214w2d presenting for scheduled repeat cesarean section. [facility] with [facility] at [facility] department since 15 weeks. Prenatal care complicated by previous cesarean section secondary to failure to descent with failed vacuum, and short cervix (no interventions other than serial cervical length measurements).  History OB History   Grav Para Term Preterm Abortions TAB SAB Ect Mult Living   3 1 1  0 1 0 1 0 0 1     Past Medical History  Diagnosis Date  . GERD (gastroesophageal reflux disease)     with pregnancy   Past Surgical History  Procedure Laterality Date  . Cesarean section     Family History: family history is not on file. Social History:  reports that she has never smoked. She does not have any smokeless tobacco history on file. She reports that she does not drink alcohol or use illicit drugs.   Prenatal Transfer Tool  Maternal Diabetes: No Genetic Screening: Normal Maternal Ultrasounds/Referrals: Normal Fetal Ultrasounds or other Referrals:  None Maternal Substance Abuse:  No Significant Maternal Medications:  None Significant Maternal Lab Results:  None Other Comments:  None  Review of Systems  All other systems reviewed and are negative.      Blood pressure 132/98, pulse 60, temperature 98.1 F (36.7 C), temperature source Oral, resp. rate 18, last menstrual period 07/31/2012, SpO2 99.00%. Exam Physical Exam  GENERAL: Well-developed, well-nourished female in no acute distress.  HEENT: Normocephalic, atraumatic. Sclerae anicteric.  NECK: Supple. Normal thyroid.  LUNGS: Clear to auscultation bilaterally.  HEART: Regular rate and rhythm. ABDOMEN: Soft, nontender, gravid PELVIC: Not indicated EXTREMITIES: No cyanosis, clubbing, or edema, 2+ distal pulses.  Prenatal labs: ABO, Rh: --/--/B POS, B POS (03/13 1555) Antibody: NEG (03/13 1555) Rubella:  Immune RPR: NON REACTIVE (03/13 1555)  HBsAg: Negative (10/02  0000)  HIV: Non-reactive (10/02 0000)  GBS:   negative  Assessment/Plan: 23 yo G3P1011 at 6414w2d here for scheduled elective repeat cesarean section - Patient was previously counseled on TOLAC and is still not interested. She wishes to proceed with elective repeat cesarean section - Risks, benefits and alternative were explained including but not limited to risks of bleeding, infection and damage to adjacent organs. Patient verbalized understanding and all questions were answered.   Teofilo Lupinacci 05/02/2013, 12:36 PM

## 2013-05-03 LAB — CBC
HCT: 31.6 % — ABNORMAL LOW (ref 36.0–46.0)
HEMOGLOBIN: 10.7 g/dL — AB (ref 12.0–15.0)
MCH: 30.5 pg (ref 26.0–34.0)
MCHC: 33.9 g/dL (ref 30.0–36.0)
MCV: 90 fL (ref 78.0–100.0)
Platelets: 114 10*3/uL — ABNORMAL LOW (ref 150–400)
RBC: 3.51 MIL/uL — AB (ref 3.87–5.11)
RDW: 13.6 % (ref 11.5–15.5)
WBC: 10.3 10*3/uL (ref 4.0–10.5)

## 2013-05-03 NOTE — Progress Notes (Signed)
Subjective: Postpartum Day 1: Cesarean Delivery Patient reports incisional pain, tolerating PO, + flatus and no problems voiding.    Objective: Vital signs in last 24 hours: Temp:  [97.3 F (36.3 C)-98.2 F (36.8 C)] 98.2 F (36.8 C) (03/17 0557) Pulse Rate:  [45-68] 54 (03/17 0205) Resp:  [16-20] 18 (03/17 0557) BP: (93-133)/(59-98) 129/84 mmHg (03/17 0557) SpO2:  [95 %-100 %] 95 % (03/17 0557) Weight:  [65.318 kg (144 lb)] 65.318 kg (144 lb) (03/16 1700)  Physical Exam:  General: alert, cooperative and no distress Lochia: appropriate Uterine Fundus: firm Incision: healing well, dressing taken down, honeycomb is saturated DVT Evaluation: No evidence of DVT seen on physical exam. Negative Homan's sign. No cords or calf tenderness. No significant calf/ankle edema.   Recent Labs  05/02/13 1220 05/03/13 0615  HGB 12.0 10.7*  HCT 34.7* 31.6*    Assessment/Plan: Status post Cesarean section. Doing well postoperatively.  H/H stable. Wound healing well. Breast and bottle feeding. Desires Mirena. Does not want circ.   Continue current care. Discuss discharge tomorrow. [name], Jonathan C 05/03/2013, 7:42 AM  Evaluation and management procedures were performed by Resident physician under my supervision/collaboration. Chart reviewed, patient examined by me and I agree with management and plan. Honeycomb dressing removed and another applied> no active bleeding. Now C/D/I. Danae Orleanseirdre C Yevette Knust, CNM 05/03/2013 11:28 AM

## 2013-05-03 NOTE — Progress Notes (Signed)
UR completed 

## 2013-05-03 NOTE — Addendum Note (Signed)
Addendum created 05/03/13 [id number] by Shanon PayorSuzanne M Skyllar Notarianni, CRNA   Modules edited: Notes Section   Notes Section:  File: [id number]

## 2013-05-03 NOTE — Lactation Note (Signed)
This note was copied from the chart of Jane Soto. Lactation Consultation Note  Patient Name: Jane Soto FAOZH'YToday's Date: 05/03/2013 Reason for consult: Follow-up assessment Per mom recently fed at 1500 for 15 mins , baby still acting hungry. LC checked diaper, placed baby skin to skin in football position on the right breast. On and off at 1st and fed 10 mins with multiply swallows, baby released,settled, then showing  Feeding cues, changed position to cross cradle and then to cradle , multiply swallows, increased  with breast compressions and still feeding. Per mom at the 3pm did not have to use the nipple shield and also LC was able to assist latch without the nipple  Shield with depth and noted a consistent pattern. Per mom comfortable. Provided a basin and dish soap to wash pump pieces and nipple shield.  Encouraged mom to call for assist as needed.    Maternal Data Formula Feeding for Exclusion: No Has patient been taught Hand Expression?: Yes  Feeding Feeding Type: Breast Fed (latched without the nipple shield ) Length of feed: 10 min  LATCH Score/Interventions Latch: Grasps breast easily, tongue down, lips flanged, rhythmical sucking. Intervention(s): Skin to skin;Teach feeding cues;Waking techniques Intervention(s): Adjust position;Breast massage;Breast compression;Assist with latch  Audible Swallowing: Spontaneous and intermittent  Type of Nipple: Everted at rest and after stimulation (no nipple shield needed )  Comfort (Breast/Nipple): Soft / non-tender     Hold (Positioning): Assistance needed to correctly position infant at breast and maintain latch. (worked on depth ) Intervention(s): Breastfeeding basics reviewed;Support Pillows;Position options;Skin to skin  LATCH Score: 9  Lactation Tools Discussed/Used Tools: Pump (hand pump at bedside ) Breast pump type: Manual Pump Review:  (reviewed )   Consult Status Consult Status: Follow-up Date:  05/04/13 Follow-up type: In-patient    Kathrin Greathouseorio, Lydian Chavous Ann 05/03/2013, 4:06 PM

## 2013-05-03 NOTE — Anesthesia Postprocedure Evaluation (Signed)
  Anesthesia Post-op Note  Patient: [name]  Procedure(s) Performed: Procedure(s): CESAREAN SECTION (N/A)  Patient Location: Mother/Baby  Anesthesia Type:Spinal  Level of Consciousness: awake, alert  and oriented  Airway and Oxygen Therapy: Patient Spontanous Breathing and Patient connected to nasal cannula oxygen  Post-op Pain: none  Post-op Assessment: Post-op Vital signs reviewed, Patient's Cardiovascular Status Stable, Respiratory Function Stable, No headache, No backache, No residual numbness and No residual motor weakness  Post-op Vital Signs: Reviewed and stable  Complications: No apparent anesthesia complications

## 2013-05-04 ENCOUNTER — Encounter (HOSPITAL_COMMUNITY): Payer: Self-pay | Admitting: Obstetrics and Gynecology

## 2013-05-04 NOTE — Progress Notes (Signed)
Subjective: Postpartum Day 2: Cesarean Delivery Patient reports incisional pain, tolerating PO, + flatus and no problems voiding.    Objective: Vital signs in last 24 hours: Temp:  [98.1 F (36.7 C)-98.8 F (37.1 C)] 98.3 F (36.8 C) (03/18 0519) Pulse Rate:  [59-67] 62 (03/18 0519) Resp:  [20] 20 (03/18 0519) BP: (126-141)/(61-79) 128/61 mmHg (03/18 0519) SpO2:  [97 %-98 %] 98 % (03/17 1413)  Physical Exam:  General: alert, cooperative and no distress Lochia: appropriate Uterine Fundus: firm Incision: healing well, dressing taken down, honeycomb changed from yesterday, no blood noted.  DVT Evaluation: No evidence of DVT seen on physical exam. Negative Homan's sign. No cords or calf tenderness. No significant calf/ankle edema.   Recent Labs  05/02/13 1220 05/03/13 0615  HGB 12.0 10.7*  HCT 34.7* 31.6*    Assessment/Plan: Status post Cesarean section. Doing well postoperatively.  H/H stable. Wound healing well. Breast and bottle feeding. Desires Mirena. Does not want circ.   Continue routine care, wants to stay until tomorrow for abdominal pain.  [name], Jonathan C 05/04/2013, 8:23 AM  I spoke with and examined patient and agree with resident's note and plan of care.  Tawana ScaleMichael Ryan Kinzi Frediani, MD OB Fellow 05/04/2013 12:56 PM

## 2013-05-05 MED ORDER — OXYCODONE-ACETAMINOPHEN 5-325 MG PO TABS: 1.0000 | ORAL_TABLET | ORAL | Status: DC | PRN

## 2013-05-05 MED ORDER — DOCUSATE SODIUM 100 MG PO CAPS: 100.0000 mg | ORAL_CAPSULE | Freq: Two times a day (BID) | ORAL | Status: DC

## 2013-05-05 MED ORDER — IBUPROFEN 600 MG PO TABS: 600.0000 mg | ORAL_TABLET | Freq: Four times a day (QID) | ORAL | Status: DC

## 2013-05-05 NOTE — Lactation Note (Addendum)
This note was copied from the chart of Jane Soto. Lactation Consultation Note Follow up consult:  [name] 2268 hours old and going home. Mother is pumping and giving breastmilk in the bottle and breastfeeding with the NS. She is sore, provided comfort gels, reviewed use and encouraged hand expressed breastmilk for soreness. Provided volume guidelines. [name] RN reviewed engorgement care. Suggested mother call if she needs further assistance. Patient Name: Jane Soto ZOXWR'UToday's Date: 05/05/2013     Maternal Data    Feeding    LATCH Score/Interventions                      Lactation Tools Discussed/Used     Consult Status Consult Status: Complete    Hardie PulleyBerkelhammer, Ruth Boschen 05/05/2013, 10:37 AM

## 2013-05-05 NOTE — Discharge Instructions (Signed)
Cesarean Delivery °Care After °Refer to this sheet in the next few weeks. These instructions provide you with information on caring for yourself after your procedure. Your health care provider may also give you specific instructions. Your treatment has been planned according to current medical practices, but problems sometimes occur. Call your health care provider if you have any problems or questions after you go home. °HOME CARE INSTRUCTIONS  °· Only take over-the-counter or prescription medications as directed by your health care provider. °· Do not drink alcohol, especially if you are breastfeeding or taking medication to relieve pain. °· Do not chew or smoke tobacco. °· Continue to use good perineal care. Good perineal care includes: °· Wiping your perineum from front to back. °· Keeping your perineum clean. °· Check your surgical cut (incision) daily for increased redness, drainage, swelling, or separation of skin. °· Clean your incision gently with soap and water every day, and then pat it dry. If your health care provider says it is OK, leave the incision uncovered. Use a bandage (dressing) if the incision is draining fluid or appears irritated. If the adhesive strips across the incision do not fall off within 7 days, carefully peel them off. °· Hug a pillow when coughing or sneezing until your incision is healed. This helps to relieve pain. °· Do not use tampons or douche until your health care provider says it is okay. °· Shower, wash your hair, and take tub baths as directed by your health care provider. °· Wear a well-fitting bra that provides breast support. °· Limit wearing support panties or control-top hose. °· Drink enough fluids to keep your urine clear or pale yellow. °· Eat high-fiber foods such as whole grain cereals and breads, brown rice, beans, and fresh fruits and vegetables every day. These foods may help prevent or relieve constipation. °· Resume activities such as climbing stairs,  driving, lifting, exercising, or traveling as directed by your health care provider. °· Talk to your health care provider about resuming sexual activities. This is dependent upon your risk of infection, your rate of healing, and your comfort and desire to resume sexual activity. °· Try to have someone help you with your household activities and your newborn for at least a few days after you leave the hospital. °· Rest as much as possible. Try to rest or take a nap when your newborn is sleeping. °· Increase your activities gradually. °· Keep all of your scheduled postpartum appointments. It is very important to keep your scheduled follow-up appointments. At these appointments, your health care provider will be checking to make sure that you are healing physically and emotionally. °SEEK MEDICAL CARE IF:  °· You are passing large clots from your vagina. Save any clots to show your health care provider. °· You have a foul smelling discharge from your vagina. °· You have trouble urinating. °· You are urinating frequently. °· You have pain when you urinate. °· You have a change in your bowel movements. °· You have increasing redness, pain, or swelling near your incision. °· You have pus draining from your incision. °· Your incision is separating. °· You have painful, hard, or reddened breasts. °· You have a severe headache. °· You have blurred vision or see spots. °· You feel sad or depressed. °· You have thoughts of hurting yourself or your newborn. °· You have questions about your care, the care of your newborn, or medications. °· You are dizzy or lightheaded. °· You have a rash. °· You   have pain, redness, or swelling at the site of the removed intravenous access (IV) tube. °· You have nausea or vomiting. °· You stopped breastfeeding and have not had a menstrual period within 12 weeks of stopping. °· You are not breastfeeding and have not had a menstrual period within 12 weeks of delivery. °· You have a fever. °SEEK  IMMEDIATE MEDICAL CARE IF: °· You have persistent pain. °· You have chest pain. °· You have shortness of breath. °· You faint. °· You have leg pain. °· You have stomach pain. °· Your vaginal bleeding saturates 2 or more sanitary pads in 1 hour. °MAKE SURE YOU:  °· Understand these instructions. °· Will watch your condition. °· Will get help right away if you are not doing well or get worse. °Document Released: 10/26/2001 Document Revised: 10/06/2012 Document Reviewed: 10/01/2011 °Exit[employer]® Patient Information ©2014 [employer]. ° ° ° °

## 2013-05-05 NOTE — Discharge Summary (Signed)
Obstetric Discharge Summary Reason for Admission: cesarean section- scheduled repeat Prenatal Procedures: ultrasound Intrapartum Procedures: cesarean: low cervical, transverse Postpartum Procedures: none Complications-Operative and Postpartum: none Hemoglobin  Date Value Ref Range Status  05/03/2013 10.7* 12.0 - 15.0 g/dL Final     HCT  Date Value Ref Range Status  05/03/2013 31.6* 36.0 - 46.0 % Final    Physical Exam:  General: alert, cooperative and no distress Lochia: appropriate Uterine Fundus: firm Incision: healing well DVT Evaluation: No evidence of DVT seen on physical exam.  Discharge Diagnoses: Term Pregnancy-delivered  Discharge Information: Date: 05/05/2013 Activity: pelvic rest Diet: routine Medications: Ibuprofen, Colace and Percocet Condition: stable Instructions: refer to practice specific booklet Discharge to: home Follow-up Information   Follow up with [facility] 4 weeks. (To discuss and schedule Mirena placement)    Contact information:   [address] [place] [address] [phone]    PROCEDURE IN DETAIL: The patient received intravenous antibiotics and had sequential compression devices applied to her lower extremities while in the preoperative area. She was then taken to the operating room where anesthesia was induced and was found to be adequate. A foley catheter was placed into her bladder and attached to Jane Soto gravity. She was then placed in a dorsal supine position with a leftward tilt, and prepped and draped in a sterile manner. After an adequate timeout was performed, a Pfannenstiel skin incision was made with scalpel and carried through to the underlying layer of fascia. The fascia was incised in the midline and this incision was extended bilaterally using the Mayo scissors. Kocher clamps were applied to the superior aspect of the fascial incision and the underlying rectus muscles were dissected off bluntly.  A similar process was carried out on the inferior aspect of the facial incision. The rectus muscles were separated in the midline bluntly and the peritoneum was entered bluntly. The Alexis self-retaining retractor was introduced into the abdominal cavity. Attention was turned to the lower uterine segment where a bladder flap was created, and a transverse hysterotomy was made with a scalpel and extended bilaterally bluntly. The infant was successfully delivered, and cord was clamped and cut and infant was handed over to awaiting neonatology team. Uterine massage was then administered and the placenta delivered intact with three-vessel cord. The uterus was cleared of clot and debris. The hysterotomy was closed with 0 Vicryl in a running locked fashion, and an imbricating layer was also placed with a 0 Vicryl. Overall, excellent hemostasis was noted. The pelvis copiously irrigated and cleared of all clot and debris. Hemostasis was confirmed on all surfaces. The fascia was then closed using 0 Vicryl in a running locked fashion. The subcutaneous layer was reapproximated with plain gut and the skin was closed in a subcuticular fashion using 3.0 Vicryl. The patient tolerated the procedure well. Sponge, lap, instrument and needle counts were correct x 2. She was taken to the recovery room in stable condition.  Jane Soto,PEGGYMD  05/02/2013 2:12 PM   Newborn Data: Live born female  Birth Weight: 6 lb 11.4 oz (3045 g) APGAR: 9, 9  Home with mother.  Brief Hospital Course: Jane Soto is a [id number] who underwent RLTCSwithout complications.  For further details, please refer to the delivery/operative note. Uncomplicated postpartum course. At time of discharge, pain was controlled on oral pain medications; she had appropriate lochia and was ambulating, voiding without difficulty, tolerating regular diet and passing flatus. She was deemed stable for discharge to home.  Jane Soto, Jane Soto 05/05/2013, 8:43 AM  I have  seen and examined this patient and I agree with the above. Jane Soto 9:05 AM 05/05/2013

## 2013-05-23 ENCOUNTER — Inpatient Hospital Stay (HOSPITAL_COMMUNITY)
Admission: AD | Admit: 2013-05-23 | Discharge: 2013-05-23 | Disposition: A | Discharge status: Home / Self Care | Payer: Medicaid Other | Source: Ambulatory Visit | Attending: Obstetrics & Gynecology | Admitting: Obstetrics & Gynecology

## 2013-05-23 ENCOUNTER — Encounter (HOSPITAL_COMMUNITY): Payer: Self-pay | Admitting: *Deleted

## 2013-05-23 DIAGNOSIS — N61 Mastitis without abscess: Secondary | ICD-10-CM

## 2013-05-23 DIAGNOSIS — R51 Headache: Secondary | ICD-10-CM | POA: Insufficient documentation

## 2013-05-23 DIAGNOSIS — O864 Pyrexia of unknown origin following delivery: Secondary | ICD-10-CM | POA: Insufficient documentation

## 2013-05-23 DIAGNOSIS — N644 Mastodynia: Secondary | ICD-10-CM | POA: Insufficient documentation

## 2013-05-23 DIAGNOSIS — K219 Gastro-esophageal reflux disease without esophagitis: Secondary | ICD-10-CM | POA: Insufficient documentation

## 2013-05-23 MED ORDER — DICLOXACILLIN SODIUM 500 MG PO CAPS
500.0000 mg | ORAL_CAPSULE | Freq: Once | ORAL | Status: AC
  Administered 2013-05-23: 500 mg via ORAL
  Filled 2013-05-23: qty 1

## 2013-05-23 MED ORDER — HYDROCODONE-ACETAMINOPHEN 5-325 MG PO TABS
1.0000 | ORAL_TABLET | Freq: Once | ORAL | Status: AC
  Administered 2013-05-23: 1 via ORAL
  Filled 2013-05-23: qty 1

## 2013-05-23 MED ORDER — HYDROCODONE-ACETAMINOPHEN 5-325 MG PO TABS: 1.0000 | ORAL_TABLET | Freq: Four times a day (QID) | ORAL | Status: DC | PRN

## 2013-05-23 MED ORDER — DICLOXACILLIN SODIUM 500 MG PO CAPS: 500.0000 mg | ORAL_CAPSULE | Freq: Four times a day (QID) | ORAL | Status: DC

## 2013-05-23 NOTE — MAU Provider Note (Signed)
Attestation of Attending Supervision of Advanced Practitioner (CNM/NP): Evaluation and management procedures were performed by the Advanced Practitioner under my supervision and collaboration.  I have reviewed the Advanced Practitioner's note and chart, and I agree with the management and plan.  HARRAWAY-SMITH, Buelah Rennie 10:33 PM

## 2013-05-23 NOTE — MAU Note (Signed)
Patient states she had a scheduled cesarean section on 3-16. States she started having pain in the left breast on 4-3 and is getting worse. Feels like she has a fever but has not taken her temperature. States she has heat and redness under the left breast. Not producing as much mild from the left breast. Has been pumping and feeding with a bottle.

## 2013-05-23 NOTE — MAU Note (Signed)
Lactation called at 1800 for consult on patient for engorgement and possible mastitis. Informed ice pack placed and pump at bedside. Lactation states someone will come down for evaluation.

## 2013-05-23 NOTE — Lactation Note (Signed)
Lactation Consultation Note  Patient Name: Jane Soto ZOXWR'UToday's Date: 05/23/2013   Mom is in MAU with s/s of mastitis and engorgement, stating that she usually pumps (double) every 2-3 hours and is only bottle-feeding because baby was "choking" on abundant milk flow from her breasts.  Mom states that she usually obtains 6 oz from each breast and is storing in freezer due to over-abundance.  Mom has been prescribed antibiotics and pain meds.  She was able to pump here in MAU for 5 minutes using symphony and obtained 2 ox from each breast but stopped when bottles were full.  [facility]  Mom planning to return to work (9-5 job) in 3 weeks and will need to reduce pumping to 4 times per day.  LC encouraged her to continue q2-3 hours pumping for next 2 days and apply ice packs between pumping for relief of swelling while following guidelines of rest, fluids and frequent pumping.  On Thursday, she will decrease pumping to 3-4 minutes twice that day and apply cabbage leaves after morning pumping.  On Friday or Saturday, she can eliminate those two pumping times and apply cabbage for 2 hours in am and pm.  LC cautioned mom to eliminate cabbage leaves if milk supply decreases below baby's needs.  LC encouraged mom to continue eliminating one additional pumping every 2-3 days until she is able to decrease pumping to 4 times per 24 hours.  Mom provided LC OP number to call as needed.  Maternal Data    Feeding    LATCH Score/Interventions     N/A - baby not here and mom is pumping only                 Lactation Tools Discussed/Used   Mastitis and engorgement protocol Plan for gradual decrease of milk production  Consult Status   Mom to call LC as needed   Lynda RainwaterBryant, Rozina Pointer [place] 05/23/2013, 7:57 PM

## 2013-05-23 NOTE — MAU Note (Signed)
Lactation at bedside. °

## 2013-05-23 NOTE — MAU Note (Signed)
Ice glove to left breast in triage.

## 2013-05-23 NOTE — MAU Provider Note (Signed)
CSN: [id number]     Arrival date & time 05/23/13  1714 History   None    Chief Complaint  Patient presents with  . Breast Pain  . Fever  . Headache     (Consider location/radiation/quality/duration/timing/severity/associated sxs/prior Treatment) Patient is a 23 y.o. female presenting with fever and headaches. The history is provided by the patient.  Fever Primary symptoms of the febrile illness include fever, headaches (when fever is up), nausea and myalgias. Primary symptoms do not include cough, wheezing, shortness of breath, abdominal pain, vomiting, dysuria or rash.  Headache The primary symptoms include headaches (when fever is up), fever and nausea. Primary symptoms do not include dizziness or vomiting.   Jane Soto is a 23 y.o. female who presents to the ED with post partum breast pain, fever and headache. Delivered 05/02/2013, trying to breast feed but she has so much milk that when she puts the baby to the breast he gets choked. She is pumping but 3 days ago began having fever and pain in the left breast. She has a headache when her fever is [place] and she feels aching all over. She took Aleve and it helped the headache, fever and aching but not the breast pain.  Past Medical History  Diagnosis Date  . GERD (gastroesophageal reflux disease)     with pregnancy   Past Surgical History  Procedure Laterality Date  . Cesarean section    . Cesarean section N/A 05/02/2013    Procedure: CESAREAN SECTION;  Surgeon: Catalina Antigua, MD;  Location: [facility];  Service: Obstetrics;  Laterality: N/A;   History reviewed. No pertinent family history. History  Substance Use Topics  . Smoking status: Never Smoker   . Smokeless tobacco: Not on file  . Alcohol Use: No   OB History   Grav Para Term Preterm Abortions TAB SAB Ect Mult Living   3 2 2  0 1 0 1 0 0 2     Review of Systems  Constitutional: Positive for fever and chills.  HENT: Negative for ear pain and sore throat.   Eyes:  Negative for visual disturbance.  Respiratory: Negative for cough, shortness of breath and wheezing.   Cardiovascular: Negative for chest pain and palpitations.  Gastrointestinal: Positive for nausea. Negative for vomiting and abdominal pain.  Genitourinary: Negative for dysuria, frequency and pelvic pain.  Musculoskeletal: Positive for myalgias.  Skin: Negative for rash.  Neurological: Positive for headaches (when fever is up). Negative for dizziness.  Psychiatric/Behavioral: Negative for confusion. The patient is not nervous/anxious.       Allergies  Review of patient's allergies indicates no known allergies.  Home Medications  No current outpatient prescriptions on file. BP 112/68  Pulse 76  Temp(Src) 98.5 F (36.9 C)  Resp 16  SpO2 99% Physical Exam  Nursing note and vitals reviewed. Constitutional: She is oriented to person, place, and time. She appears well-developed and well-nourished.  HENT:  Head: Normocephalic.  Eyes: Conjunctivae and EOM are normal.  Neck: Neck supple.  Cardiovascular: Normal rate.   Pulmonary/Chest: Effort normal. Left breast exhibits tenderness.    Left breast with swelling, erythema and tenderness.  Abdominal: Soft. There is no tenderness.  Musculoskeletal: Normal range of motion.  Neurological: She is alert and oriented to person, place, and time. No cranial nerve deficit.  Skin: Skin is warm and dry.  Psychiatric: She has a normal mood and affect. Her behavior is normal.    ED Course  Procedures  Lactation consultant called and will  see the patient.  I will start Dicloxacillin and pain medication.   MDM  After pain medication and first dose of Dicloxacillin and pumping, the patient is feeling much better. Pain down from 9/10 to 4/10. She will continue ice, ibuprofen, pumping. She will take the antibiotics and pain medication as directed. Discussed with the patient plan of care and all questioned fully answered. Stable for discharge  without fever or signs of sepsis.    Medication List    TAKE these medications       dicloxacillin 500 MG capsule  Commonly known as:  DYNAPEN  Take 1 capsule (500 mg total) by mouth 4 (four) times daily.     HYDROcodone-acetaminophen 5-325 MG per tablet  Commonly known as:  NORCO  Take 1 tablet by mouth every 6 (six) hours as needed for moderate pain.      ASK your doctor about these medications       ibuprofen 200 MG tablet  Commonly known as:  ADVIL,MOTRIN  Take 600 mg by mouth every 6 (six) hours as needed for moderate pain.     ibuprofen 600 MG tablet  Commonly known as:  ADVIL,MOTRIN  Take 1 tablet (600 mg total) by mouth every 6 (six) hours.

## 2013-05-23 NOTE — MAU Note (Signed)
Lactation called for evaluation. Informed of interventions done: medication, pumping, hand expression. States will be down to evaluate.

## 2013-05-23 NOTE — MAU Note (Signed)
Patient pumping at bedside with electric double pump. Education given on hand expression. Awaiting evaluation from lactation. Ice Packs refilled and given.

## 2013-05-23 NOTE — Discharge Instructions (Signed)
Take the medication as directed. Do not take the narcotic if you are driving. Continue to take ibuprofen. Return for worsening symptoms. Continue to apply ice packs and pump.   Breastfeeding and Mastitis Mastitis is inflammation of the breast tissue. It can occur in women who are breastfeeding. This can make breastfeeding painful. Mastitis will sometimes go away on its own. Your health care provider will help determine if treatment is needed. CAUSES Mastitis is often associated with a blocked milk (lactiferous) duct. This can happen when too much milk builds up in the breast. Causes of excess milk in the breast can include:  Poor latch-on. If your baby is not latched onto the breast properly, she or he may not empty your breast completely while breastfeeding.  Allowing too much time to pass between feedings.  Wearing a bra or other clothing that is too tight. This puts extra pressure on the lactiferous ducts so milk does not flow through them as it should. Mastitis can also be caused by a bacterial infection. Bacteria may enter the breast tissue through cuts or openings in the skin. In women who are breastfeeding, this may occur because of cracked or irritated skin. Cracks in the skin are often caused when your baby does not latch on properly to the breast. SIGNS AND SYMPTOMS  Swelling, redness, tenderness, and pain in an area of the breast.  Swelling of the glands under the arm on the same side.  Fever may or may not accompany mastitis. If an infection is allowed to progress, a collection of pus (abscess) may develop. DIAGNOSIS  Your health care provider can usually diagnose mastitis based on your symptoms and a physical exam. Tests may be done to help confirm the diagnosis. These may include:  Removal of pus from the breast by applying pressure to the area. This pus can be examined in the lab to determine which bacteria are present. If an abscess has developed, the fluid in the abscess can  be removed with a needle. This can also be used to confirm the diagnosis and determine the bacteria present. In most cases, pus will not be present.  Blood tests to determine if your body is fighting a bacterial infection.  Mammogram or ultrasound tests to rule out other problems or diseases. TREATMENT  Mastitis that occurs with breastfeeding will sometimes go away on its own. Your health care provider may choose to wait 24 hours after first seeing you to decide whether a prescription medicine is needed. If your symptoms are worse after 24 hours, your health care provider will likely prescribe an antibiotic to treat the mastitis. He or she will determine which bacteria are most likely causing the infection and will then select an appropriate antibiotic. This is sometimes changed based on the results of tests performed to identify the bacteria, or if there is no response to the antibiotic selected. Antibiotics are usually given by mouth. You may also be given medicine for pain. HOME CARE INSTRUCTIONS  Only take over-the-counter or prescription medicines for pain, fever, or discomfort as directed by your health care provider.  If your health care provider prescribed an antibiotic, take the medicine as directed. Make sure you finish it even if you start to feel better.  Do not wear a tight or underwire bra. Wear a soft, supportive bra.  Increase your fluid intake, especially if you have a fever.  Continue to empty the breast. Your health care provider can tell you whether this milk is safe for your  infant or needs to be thrown out. You may be told to stop nursing until your health care provider thinks it is safe for your baby. Use a breast pump if you are advised to stop nursing.  Keep your nipples clean and dry.  Empty the first breast completely before going to the other breast. If your baby is not emptying your breasts completely for some reason, use a breast pump to empty your breasts.  If  you go back to work, pump your breasts while at work to stay in time with your nursing schedule.  Avoid allowing your breasts to become overly filled with milk (engorged). SEEK MEDICAL CARE IF:  You have pus-like discharge from the breast.  Your symptoms do not improve with the treatment prescribed by your health care provider within 2 days. SEEK IMMEDIATE MEDICAL CARE IF:  Your pain and swelling are getting worse.  You have pain that is not controlled with medicine.  You have a red line extending from the breast toward your armpit.  You have a fever or persistent symptoms for more than 2 3 days.  You have a fever and your symptoms suddenly get worse. MAKE SURE YOU:   Understand these instructions.  Will watch your condition.  Will get help right away if you are not doing well or get worse. Document Released: 05/31/2004 Document Revised: 10/06/2012 Document Reviewed: 09/09/2012 [facility] Patient Information 2014 [place], [place].

## 2013-07-13 ENCOUNTER — Inpatient Hospital Stay (HOSPITAL_COMMUNITY): Payer: Medicaid Other

## 2013-07-13 ENCOUNTER — Inpatient Hospital Stay (HOSPITAL_COMMUNITY)
Admission: AD | Admit: 2013-07-13 | Discharge: 2013-07-13 | Disposition: A | Discharge status: Home / Self Care | Payer: Medicaid Other | Source: Ambulatory Visit | Attending: Family Medicine | Admitting: Family Medicine

## 2013-07-13 ENCOUNTER — Encounter (HOSPITAL_COMMUNITY): Payer: Self-pay | Admitting: *Deleted

## 2013-07-13 DIAGNOSIS — N83202 Unspecified ovarian cyst, left side: Secondary | ICD-10-CM

## 2013-07-13 DIAGNOSIS — N938 Other specified abnormal uterine and vaginal bleeding: Secondary | ICD-10-CM | POA: Insufficient documentation

## 2013-07-13 DIAGNOSIS — R109 Unspecified abdominal pain: Secondary | ICD-10-CM | POA: Insufficient documentation

## 2013-07-13 DIAGNOSIS — N926 Irregular menstruation, unspecified: Secondary | ICD-10-CM | POA: Insufficient documentation

## 2013-07-13 DIAGNOSIS — N83209 Unspecified ovarian cyst, unspecified side: Secondary | ICD-10-CM | POA: Insufficient documentation

## 2013-07-13 DIAGNOSIS — N925 Other specified irregular menstruation: Secondary | ICD-10-CM | POA: Insufficient documentation

## 2013-07-13 DIAGNOSIS — Z975 Presence of (intrauterine) contraceptive device: Secondary | ICD-10-CM | POA: Insufficient documentation

## 2013-07-13 DIAGNOSIS — N854 Malposition of uterus: Secondary | ICD-10-CM | POA: Insufficient documentation

## 2013-07-13 DIAGNOSIS — N949 Unspecified condition associated with female genital organs and menstrual cycle: Secondary | ICD-10-CM | POA: Insufficient documentation

## 2013-07-13 LAB — URINALYSIS, ROUTINE W REFLEX MICROSCOPIC
Bilirubin Urine: NEGATIVE
GLUCOSE, UA: NEGATIVE mg/dL
KETONES UR: NEGATIVE mg/dL
LEUKOCYTES UA: NEGATIVE
Nitrite: NEGATIVE
Protein, ur: NEGATIVE mg/dL
SPECIFIC GRAVITY, URINE: 1.025 (ref 1.005–1.030)
Urobilinogen, UA: 0.2 mg/dL (ref 0.0–1.0)
pH: 5.5 (ref 5.0–8.0)

## 2013-07-13 LAB — CBC
HEMATOCRIT: 37.4 % (ref 36.0–46.0)
HEMOGLOBIN: 12.7 g/dL (ref 12.0–15.0)
MCH: 29.6 pg (ref 26.0–34.0)
MCHC: 34 g/dL (ref 30.0–36.0)
MCV: 87.2 fL (ref 78.0–100.0)
Platelets: 207 10*3/uL (ref 150–400)
RBC: 4.29 MIL/uL (ref 3.87–5.11)
RDW: 12.9 % (ref 11.5–15.5)
WBC: 6.3 10*3/uL (ref 4.0–10.5)

## 2013-07-13 LAB — URINE MICROSCOPIC-ADD ON

## 2013-07-13 LAB — POCT PREGNANCY, URINE: Preg Test, Ur: NEGATIVE

## 2013-07-13 MED ORDER — TRAMADOL HCL 50 MG PO TABS
50.0000 mg | ORAL_TABLET | Freq: Four times a day (QID) | ORAL | Status: DC | PRN
Start: 2013-07-13 — End: 2013-11-09

## 2013-07-13 NOTE — MAU Note (Signed)
Patient states [facility]. States she has had bleeding since, sometime heavy, and abdominal pain. States she cannot feel the strings. States she has had a IUD before and did not feel like this.

## 2013-07-13 NOTE — Discharge Instructions (Signed)
Intrauterine Device Information An intrauterine device (IUD) is inserted into your uterus to prevent pregnancy. There are two types of IUDs available:   Copper IUD This type of IUD is wrapped in copper wire and is placed inside the uterus. Copper makes the uterus and fallopian tubes produce a fluid that kills sperm. The copper IUD can stay in place for 10 years.  Hormone IUD This type of IUD contains the hormone progestin (synthetic progesterone). The hormone thickens the cervical mucus and prevents sperm from entering the uterus. It also thins the uterine lining to prevent implantation of a fertilized egg. The hormone can weaken or kill the sperm that get into the uterus. One type of hormone IUD can stay in place for 5 years, and another type can stay in place for 3 years. Your health care provider will make sure you are a good candidate for a contraceptive IUD. Discuss with your health care provider the possible side effects.  ADVANTAGES OF AN INTRAUTERINE DEVICE  IUDs are highly effective, reversible, long acting, and low maintenance.   There are no estrogen-related side effects.   An IUD can be used when breastfeeding.   IUDs are not associated with weight gain.   The copper IUD works immediately after insertion.   The hormone IUD works right away if inserted within 7 days of your period starting. You will need to use a backup method of birth control for 7 days if the hormone IUD is inserted at any other time in your cycle.  The copper IUD does not interfere with your female hormones.   The hormone IUD can make heavy menstrual periods lighter and decrease cramping.   The hormone IUD can be used for 3 or 5 years.   The copper IUD can be used for 10 years. DISADVANTAGES OF AN INTRAUTERINE DEVICE  The hormone IUD can be associated with irregular bleeding patterns.   The copper IUD can make your menstrual flow heavier and more painful.   You may experience cramping and  vaginal bleeding after insertion.  Document Released: 01/08/2004 Document Revised: 10/06/2012 Document Reviewed: 07/25/2012 [facility] Patient Information 2014 [place], [place].  Ovarian Cyst An ovarian cyst is a fluid-filled sac that forms on an ovary. The ovaries are small organs that produce eggs in women. Various types of cysts can form on the ovaries. Most are not cancerous. Many do not cause problems, and they often go away on their own. Some may cause symptoms and require treatment. Common types of ovarian cysts include:  Functional cysts These cysts may occur every month during the menstrual cycle. This is normal. The cysts usually go away with the next menstrual cycle if the woman does not get pregnant. Usually, there are no symptoms with a functional cyst.  Endometrioma cysts These cysts form from the tissue that lines the uterus. They are also called "chocolate cysts" because they become filled with blood that turns brown. This type of cyst can cause pain in the lower abdomen during intercourse and with your menstrual period.  Cystadenoma cysts This type develops from the cells on the outside of the ovary. These cysts can get very big and cause lower abdomen pain and pain with intercourse. This type of cyst can twist on itself, cut off its blood supply, and cause severe pain. It can also easily rupture and cause a lot of pain.  Dermoid cysts This type of cyst is sometimes found in both ovaries. These cysts may contain different kinds of body tissue, such  as skin, teeth, hair, or cartilage. They usually do not cause symptoms unless they get very big.  Theca lutein cysts These cysts occur when too much of a certain hormone (human chorionic gonadotropin) is produced and overstimulates the ovaries to produce an egg. This is most common after procedures used to assist with the conception of a baby (in vitro fertilization). CAUSES   Fertility drugs can cause a condition in which multiple large  cysts are formed on the ovaries. This is called ovarian hyperstimulation syndrome.  A condition called polycystic ovary syndrome can cause hormonal imbalances that can lead to nonfunctional ovarian cysts. SIGNS AND SYMPTOMS  Many ovarian cysts do not cause symptoms. If symptoms are present, they may include:  Pelvic pain or pressure.  Pain in the lower abdomen.  Pain during sexual intercourse.  Increasing girth (swelling) of the abdomen.  Abnormal menstrual periods.  Increasing pain with menstrual periods.  Stopping having menstrual periods without being pregnant. DIAGNOSIS  These cysts are commonly found during a routine or annual pelvic exam. Tests may be ordered to find out more about the cyst. These tests may include:  Ultrasound.  X-ray of the pelvis.  CT scan.  MRI.  Blood tests. TREATMENT  Many ovarian cysts go away on their own without treatment. Your health care provider may want to check your cyst regularly for 2 3 months to see if it changes. For women in menopause, it is particularly important to monitor a cyst closely because of the higher rate of ovarian cancer in menopausal women. When treatment is needed, it may include any of the following:  A procedure to drain the cyst (aspiration). This may be done using a long needle and ultrasound. It can also be done through a laparoscopic procedure. This involves using a thin, lighted tube with a tiny camera on the end (laparoscope) inserted through a small incision.  Surgery to remove the whole cyst. This may be done using laparoscopic surgery or an open surgery involving a larger incision in the lower abdomen.  Hormone treatment or birth control pills. These methods are sometimes used to help dissolve a cyst. HOME CARE INSTRUCTIONS   Only take over-the-counter or prescription medicines as directed by your health care provider.  Follow up with your health care provider as directed.  Get regular pelvic exams and  Pap tests. SEEK MEDICAL CARE IF:   Your periods are late, irregular, or painful, or they stop.  Your pelvic pain or abdominal pain does not go away.  Your abdomen becomes larger or swollen.  You have pressure on your bladder or trouble emptying your bladder completely.  You have pain during sexual intercourse.  You have feelings of fullness, pressure, or discomfort in your stomach.  You lose weight for no apparent reason.  You feel generally ill.  You become constipated.  You lose your appetite.  You develop acne.  You have an increase in body and facial hair.  You are gaining weight, without changing your exercise and eating habits.  You think you are pregnant. SEEK IMMEDIATE MEDICAL CARE IF:   You have increasing abdominal pain.  You feel sick to your stomach (nauseous), and you throw up (vomit).  You develop a fever that comes on suddenly.  You have abdominal pain during a bowel movement.  Your menstrual periods become heavier than usual. Document Released: 02/03/2005 Document Revised: 11/24/2012 Document Reviewed: 10/11/2012 [facility] Patient Information 2014 [place].

## 2013-07-13 NOTE — MAU Provider Note (Signed)
History     CSN: [id number]  Arrival date and time: 07/13/13 1206   None     Chief Complaint  Patient presents with  . Vaginal Bleeding  . Abdominal Pain   HPI This is a 23 y.o. female who presents with c/o bleeding irregularly since early May. Also has some abdominal/pelvic cramping.  Had a Mirena IUD placed then and cannot feel strings. Is worried it has displaced.   RN Note:  Patient [facility] States she has had bleeding since, sometime heavy, and abdominal pain. States she cannot feel the strings. States she has had a IUD before and did not feel like this.        OB History   Grav Para Term Preterm Abortions TAB SAB Ect Mult Living   3 2 2  0 1 0 1 0 0 2      Past Medical History  Diagnosis Date  . GERD (gastroesophageal reflux disease)     with pregnancy    Past Surgical History  Procedure Laterality Date  . Cesarean section    . Cesarean section N/A 05/02/2013    Procedure: CESAREAN SECTION;  Surgeon: Catalina Antigua, MD;  Location: [facility];  Service: Obstetrics;  Laterality: N/A;    History reviewed. No pertinent family history.  History  Substance Use Topics  . Smoking status: Never Smoker   . Smokeless tobacco: Not on file  . Alcohol Use: No    Allergies: No Known Allergies  No prescriptions prior to admission    Review of Systems  Constitutional: Negative for fever, chills and malaise/fatigue.  Gastrointestinal: Positive for abdominal pain. Negative for nausea and vomiting.  Genitourinary:       Irregular bleeding  Neurological: Negative for headaches.   Physical Exam   Blood pressure 117/78, pulse 52, temperature 98 F (36.7 C), temperature source Oral, resp. rate 18, height 5\' 1"  (1.549 m), weight 52.073 kg (114 lb 12.8 oz), SpO2 99.00%, unknown if currently breastfeeding.  Physical Exam  Constitutional: She is oriented to person, place, and time. She appears well-developed and  well-nourished. No distress.  HENT:  Head: Normocephalic.  Cardiovascular: Normal rate.   Respiratory: Effort normal.  GI: Soft. She exhibits no distension. There is tenderness (generalized pelvic tenderness, does not localize). There is no rebound and no guarding.  Genitourinary: Vaginal discharge (bloody mucous, string visible) found.  Musculoskeletal: Normal range of motion.  Neurological: She is alert and oriented to person, place, and time.  Skin: Skin is warm and dry.  Psychiatric: She has a normal mood and affect.  Results for TEAGYN, GIERKE (MRN [id number]) as of 07/17/2013 13:35  Ref. Range 07/13/2013 13:17  Hemoglobin Latest Range: 12.0-15.0 g/dL 15.1  HCT Latest Range: 36.0-46.0 % 37.4   Results for ZYLEIGH, ZEITLIN (MRN [id number]) as of 07/17/2013 13:35  Ref. Range 07/13/2013 12:45  Color, Urine Latest Range: YELLOW  YELLOW  APPearance Latest Range: CLEAR  CLEAR  Specific Gravity, Urine Latest Range: 1.005-1.030  1.025  pH Latest Range: 5.0-8.0  5.5  Glucose Latest Range: NEGATIVE mg/dL NEGATIVE  Bilirubin Urine Latest Range: NEGATIVE  NEGATIVE  Ketones, ur Latest Range: NEGATIVE mg/dL NEGATIVE  Protein Latest Range: NEGATIVE mg/dL NEGATIVE  Urobilinogen, UA Latest Range: 0.0-1.0 mg/dL 0.2  Nitrite Latest Range: NEGATIVE  NEGATIVE  Leukocytes, UA Latest Range: NEGATIVE  NEGATIVE  Hgb urine dipstick Latest Range: NEGATIVE  TRACE (A)  Urine-Other No range found MUCOUS PRESENT  WBC,  UA Latest Range: <3 WBC/hpf 3-6  RBC / HPF Latest Range: <3 RBC/hpf 0-2  Squamous Epithelial / LPF Latest Range: RARE  FEW (A)  Bacteria, UA Latest Range: RARE  FEW (A)  Results for Lennon AlstromDELGADO, Wynema (MRN [id number]) as of 07/17/2013 13:35  Ref. Range 07/13/2013 13:28  Preg Test, Ur Latest Range: NEGATIVE  NEGATIVE   MAU Course  Procedures  MDM [place] Transvaginal Non-ob  07/13/2013   CLINICAL DATA:  Post C-section. Recent placement of IUD. Pain, bleeding.  EXAM: TRANSABDOMINAL ULTRASOUND OF PELVIS   TECHNIQUE: Transabdominal ultrasound examination of the pelvis was performed including evaluation of the uterus, ovaries, adnexal regions, and pelvic cul-de-sac.  COMPARISON:  Ob ultrasound 02/21/2013    FINDINGS: Uterus  Measurements: 6.7 x 3.8 x 4.7 cm, retroverted. No fibroids or other mass visualized.  Endometrium  Thickness: Normal in appearance and thickness, 5 mm. IUD in place in expected position. No focal abnormality visualized.  Right ovary  Measurements: 3.9 x 2.2 x 2.2 cm. Normal appearance/no adnexal mass.  Left ovary  Measurements: 4.9 x 4.9 x 4.0 cm. There is a 4.3 x 4.2 x 3.7 cm cyst within the left ovary. 2.5 cm solid component along the wall without internal blood flow. Question blood clot.  Other findings:  Trace free fluid in the pelvis.    IMPRESSION: Retroverted uterus.  IUD in expected position.  Complex left ovarian cyst measuring up to 4.3 cm. Rounded solid component peripherally could reflect small blood clot.   This could be followed with repeat ultrasound in 3-6 months to confirm resolution/ improvement.     Electronically Signed   By: Charlett NoseKevin  Dover M.D.   On: 07/13/2013 16:17    Assessment and Plan  A:  Irregular bleeding      Pelvic pain      Complex Left Ovarian Cyst      IUD in place  P:  Discussed with Dr Shawnie PonsPratt       WIll recommend repeat US sooner, in 2 months with follow up in clinic       Recommend ibuprofen for bleeding and cramps         Aviva SignsMarie L Calil Amor 07/13/2013, 3:09 PM

## 2013-07-18 NOTE — MAU Provider Note (Signed)
Attestation of Attending Supervision of Advanced Practitioner (PA/CNM/NP): Evaluation and management procedures were performed by the Advanced Practitioner under my supervision and collaboration.  I have reviewed the Advanced Practitioner's note and chart, and I agree with the management and plan.  Reva Bores, MD [facility] Attending 07/18/2013 8:19 AM

## 2013-09-12 ENCOUNTER — Ambulatory Visit (HOSPITAL_COMMUNITY)
Admission: RE | Admit: 2013-09-12 | Discharge: 2013-09-12 | Disposition: A | Discharge status: Home / Self Care | Payer: Self-pay | Source: Ambulatory Visit | Attending: Advanced Practice Midwife | Admitting: Advanced Practice Midwife

## 2013-09-12 ENCOUNTER — Other Ambulatory Visit (HOSPITAL_COMMUNITY): Payer: Self-pay | Admitting: Advanced Practice Midwife

## 2013-09-12 DIAGNOSIS — N83202 Unspecified ovarian cyst, left side: Secondary | ICD-10-CM

## 2013-09-12 DIAGNOSIS — Z975 Presence of (intrauterine) contraceptive device: Secondary | ICD-10-CM

## 2013-09-12 DIAGNOSIS — N83209 Unspecified ovarian cyst, unspecified side: Secondary | ICD-10-CM | POA: Insufficient documentation

## 2013-09-12 DIAGNOSIS — Z30431 Encounter for routine checking of intrauterine contraceptive device: Secondary | ICD-10-CM | POA: Insufficient documentation

## 2013-09-22 ENCOUNTER — Inpatient Hospital Stay (HOSPITAL_COMMUNITY)
Admission: AD | Admit: 2013-09-22 | Discharge: 2013-09-22 | Disposition: A | Discharge status: Home / Self Care | Payer: Self-pay | Source: Ambulatory Visit | Attending: Obstetrics & Gynecology | Admitting: Obstetrics & Gynecology

## 2013-09-22 DIAGNOSIS — N83209 Unspecified ovarian cyst, unspecified side: Secondary | ICD-10-CM

## 2013-09-22 NOTE — MAU Note (Signed)
Patient is not in the lobby when called to triage.  

## 2013-09-22 NOTE — MAU Provider Note (Signed)
History     CSN: [id number]  Arrival date and time: 09/22/13 1206   None     Chief Complaint  Patient presents with  . Follow-up   HPI This is a 23 y.o. female who presents requesting results from the US she had done on 09/12/13 which was a 2 month followup of a complex ovarian cyst. Denies pain. Does not want to wait for clinic appt in August.   RN Note: Patient to MAU for ultrasound results from 7-27. Denies pain or bleeding at this time. Has an appointment in clinic 8-24.      Previous US 07/13/13: IMPRESSION: Retroverted uterus. IUD in expected position. Complex left ovarian cyst measuring up to 4.3 cm. Rounded solid component peripherally could reflect small blood clot.  This could be followed with repeat ultrasound in 3-6 months to confirm resolution/ improvement.      OB History   Grav Para Term Preterm Abortions TAB SAB Ect Mult Living   3 2 2  0 1 0 1 0 0 2      Past Medical History  Diagnosis Date  . GERD (gastroesophageal reflux disease)     with pregnancy    Past Surgical History  Procedure Laterality Date  . Cesarean section    . Cesarean section N/A 05/02/2013    Procedure: CESAREAN SECTION;  Surgeon: Catalina AntiguaPeggy Constant, MD;  Location: [facility];  Service: Obstetrics;  Laterality: N/A;    No family history on file.  History  Substance Use Topics  . Smoking status: Never Smoker   . Smokeless tobacco: Not on file  . Alcohol Use: No    Allergies: No Known Allergies  Prescriptions prior to admission  Medication Sig Dispense Refill  . traMADol (ULTRAM) 50 MG tablet Take 1 tablet (50 mg total) by mouth every 6 (six) hours as needed.  30 tablet  0    Review of Systems  Constitutional: Negative for fever, chills and malaise/fatigue.  Gastrointestinal: Negative for nausea, vomiting and abdominal pain.   Physical Exam   unknown if currently breastfeeding.  Physical Exam  Constitutional: She is oriented to person, place, and time. She appears  well-developed and well-nourished. No distress.  HENT:  Head: Normocephalic.  Cardiovascular: Normal rate.   Respiratory: Effort normal.  Musculoskeletal: Normal range of motion.  Neurological: She is alert and oriented to person, place, and time.  Skin: Skin is warm and dry.  Psychiatric: She has a normal mood and affect.    MAU Course  Procedures  MDM [place] Transvaginal Non-ob  09/12/2013   CLINICAL DATA:  Follow-up left ovarian cyst.  IUD.  EXAM: TRANSVAGINAL ULTRASOUND OF PELVIS  TECHNIQUE: Transvaginal ultrasound examination of the pelvis was performed including evaluation of the uterus, ovaries, adnexal regions, and pelvic cul-de-sac.  COMPARISON:  Ultrasound 07/13/2013  FINDINGS: Uterus  Measurements: 6.6 x 3.5 x 4.7 cm. No fibroids or other mass visualized.  Endometrium  Thickness: IUD remains in the urine fundus in good position and unchanged. Endometrium measures 2.2 mm in thickness. No focal abnormality visualized.  Right ovary  Measurements: 2.4 x 1.5 x 1.7 cm. Normal appearance/no adnexal mass.  Left ovary  Measurements: 2.9 x 1.6 x 1.2 cm. Complex cyst left adnexa has resolved since the prior study. Normal appearance/no adnexal mass.  Other findings:  No free fluid    IMPRESSION: IUD remains in good position  Interval resolution of left ovarian cyst.     Electronically Signed   By: Marlan Palauharles  Clark M.D.   On:  09/12/2013 16:43    Assessment and Plan  A:  Complex ovarian cyst, resolved  P:  Discussed results       Pt would like to cancel her clinic appt if cyst is gone       Will send message.  Ashlyn Cabler 09/22/2013, 1:20 PM

## 2013-09-22 NOTE — MAU Note (Signed)
Patient to MAU for ultrasound results from 7-27. Denies pain or bleeding at this time. Has an appointment in clinic 8-24.

## 2013-09-23 NOTE — Discharge Instructions (Signed)
Ovarian Cyst °An ovarian cyst is a sac filled with fluid or blood. This sac is attached to the ovary. Some cysts go away on their own. Other cysts need treatment.  °HOME CARE  °· Only take medicine as told by your doctor. °· Follow up with your doctor as told. °· Get regular pelvic exams and Pap tests. °GET HELP IF: °· Your periods are late, not regular, or painful. °· You stop having periods. °· Your belly (abdominal) or pelvic pain does not go away. °· Your belly becomes large or puffy (swollen). °· You have a hard time peeing (totally emptying your bladder). °· You have pressure on your bladder. °· You have pain during sex. °· You feel fullness, pressure, or discomfort in your belly. °· You lose weight for no reason. °· You feel sick most of the time. °· You have a hard time pooping (constipation). °· You do not feel like eating. °· You develop pimples (acne). °· You have an increase in hair on your body and face. °· You are gaining weight for no reason. °· You think you are pregnant. °GET HELP RIGHT AWAY IF:  °· Your belly pain gets worse. °· You feel sick to your stomach (nauseous), and you throw up (vomit). °· You have a fever that comes on fast. °· You have belly pain while pooping (bowel movement). °· Your periods are heavier than usual. °MAKE SURE YOU:  °· Understand these instructions. °· Will watch your condition. °· Will get help right away if you are not doing well or get worse. °Document Released: 07/23/2007 Document Revised: 11/24/2012 Document Reviewed: 10/11/2012 °ExitCare® Patient Information ©2015 [employer]. This information is not intended to replace advice given to you by your health care provider. Make sure you discuss any questions you have with your health care provider. ° °

## 2013-10-10 ENCOUNTER — Ambulatory Visit: Payer: Medicaid Other | Admitting: Obstetrics & Gynecology

## 2013-11-01 IMAGING — US US ART/VEN ABD/PELV/SCROTUM DOPPLER LTD
1 series · 13 of 25 positions shown · non-contrast
Comparison: None.

CLINICAL DATA: Vaginal bleeding for 4 days.  Positive home
pregnancy test but urine and beta HCG testing here are not
negative.  Abdominal pain.  Query spontaneous abortion, retained
products of conception, or ovarian torsion.

TRANSABDOMINAL AND TRANSVAGINAL ULTRASOUND OF PELVIS
DOPPLER ULTRASOUND OF OVARIES
TECHNIQUE: Both transabdominal and transvaginal ultrasound
examinations of the pelvis were performed. Transabdominal technique
was performed for global imaging of the pelvis including uterus,
ovaries, adnexal regions, and pelvic cul-de-sac.
It was necessary to proceed with endovaginal exam following the
transabdominal exam to visualize the uterus and ovaries..
Color and duplex Doppler ultrasound was utilized to evaluate blood
flow to the ovaries.

[Series 1: us art/ven abd/pelv/scrotum doppler ltd · 0.18mm/px · 13 of 70 slices shown]
[im 1/70]
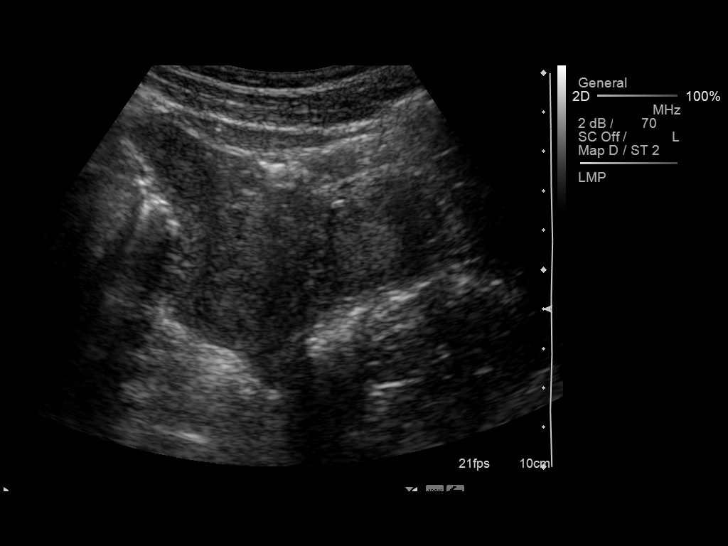
[im 6/70]
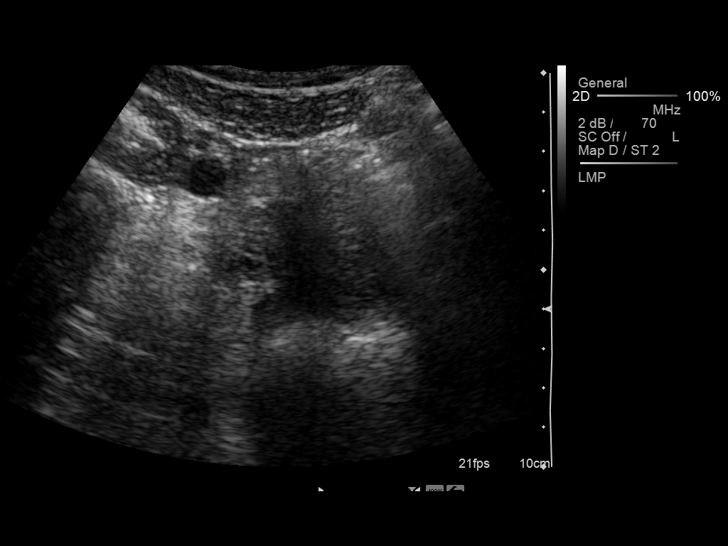
[im 12/70]
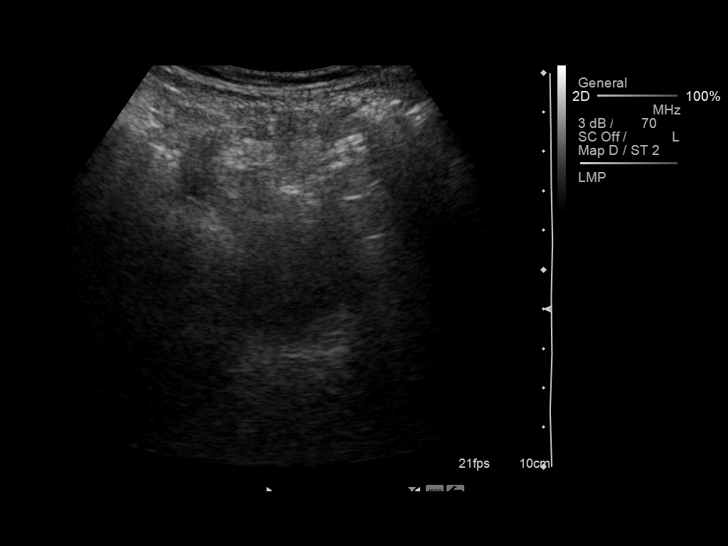
[im 18/70]
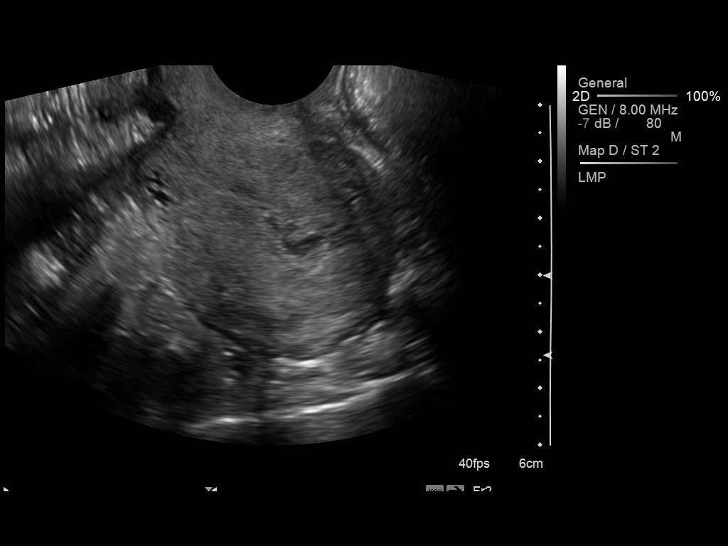
[im 24/70]
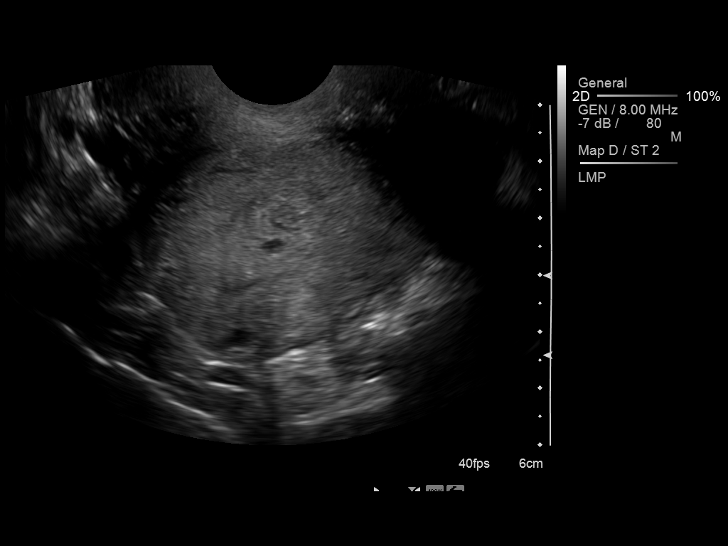
[im 29/70]
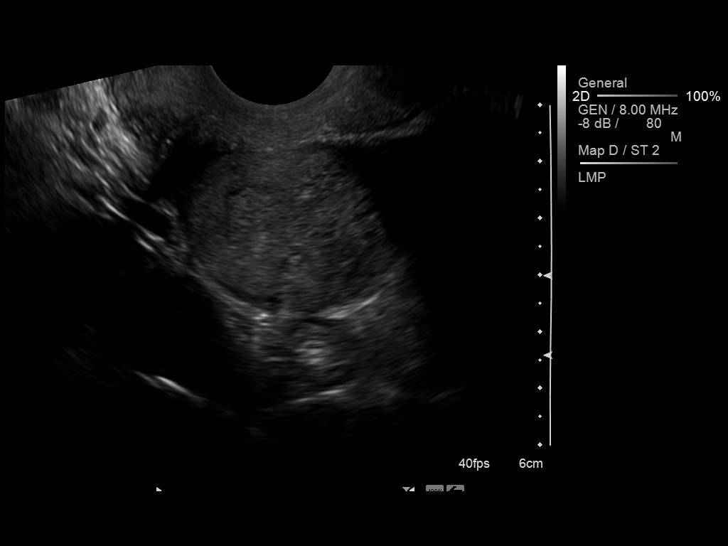
[im 35/70]
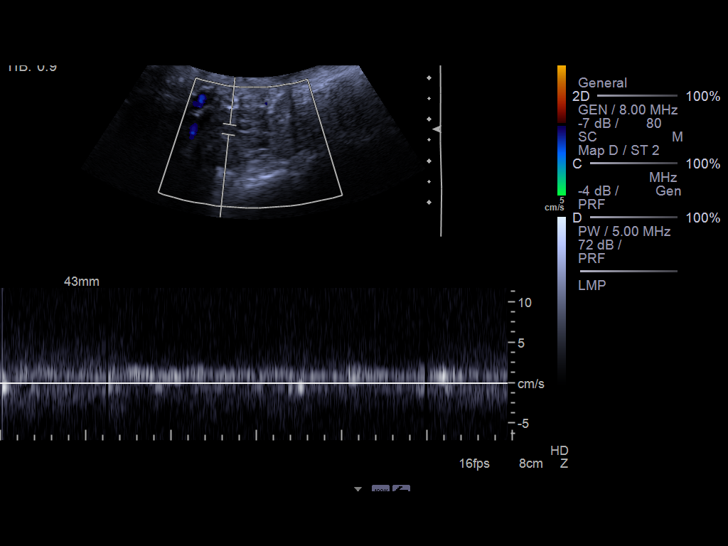
[im 41/70]
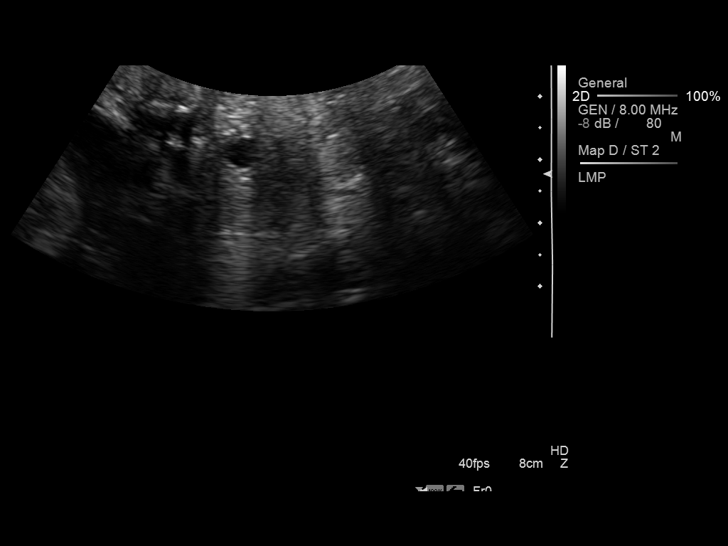
[im 47/70]
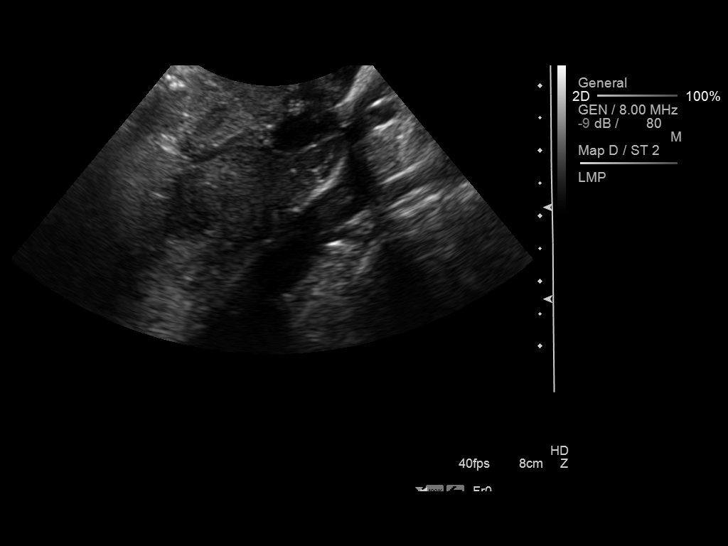
[im 52/70]
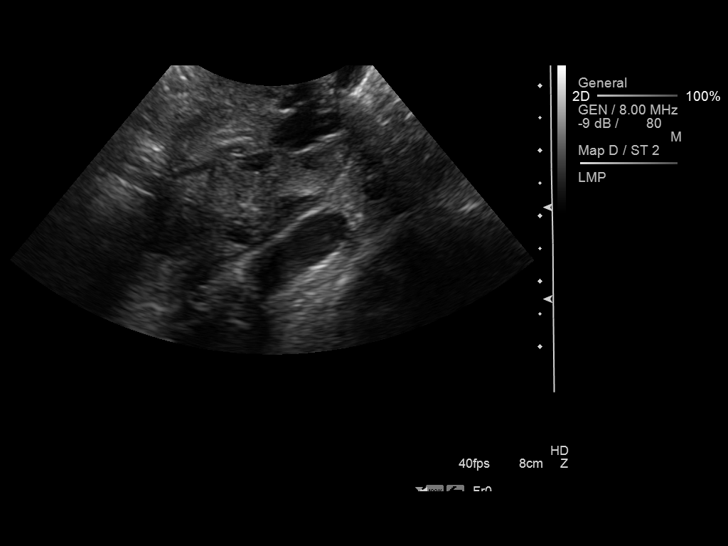
[im 58/70]
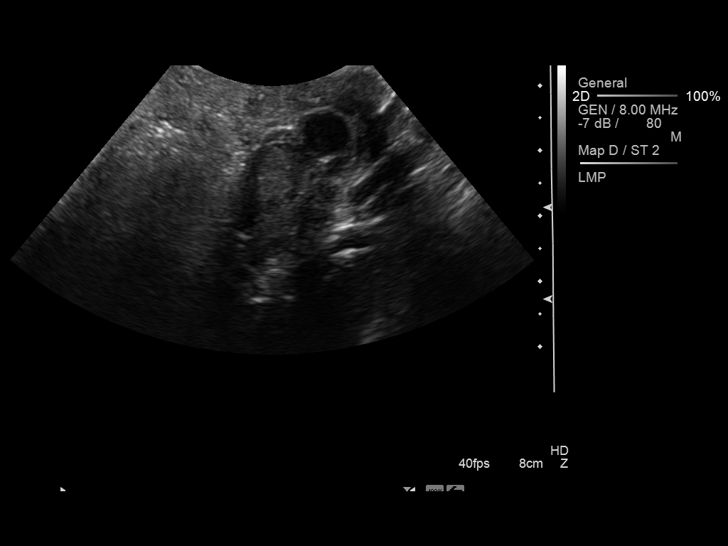
[im 64/70]
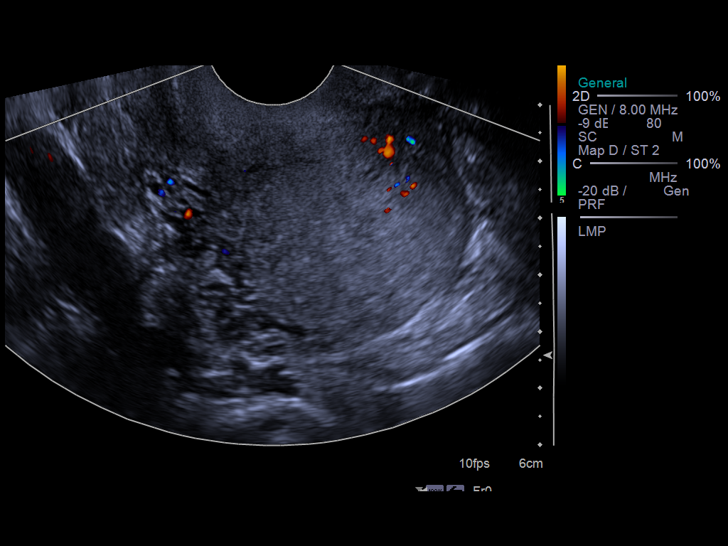
[im 70/70]
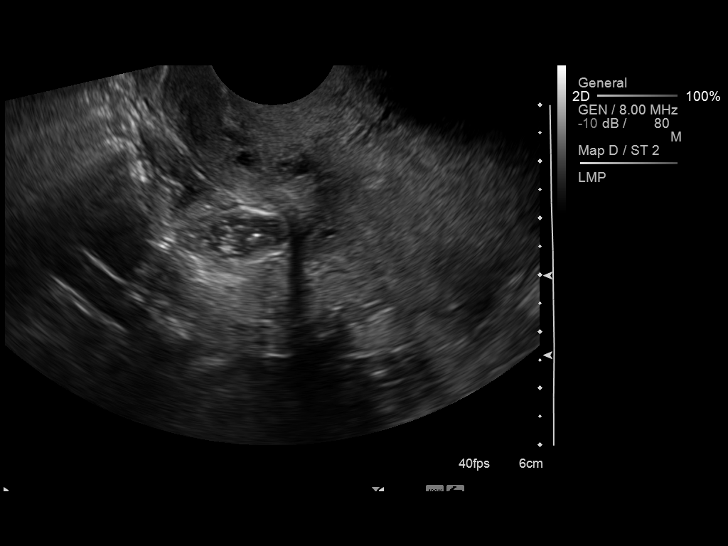

[13 of 25 positions shown; findings below may reference images not displayed]

FINDINGS: Uterus:  Uterus is retroverted and measures about 6.6 x 4.5 x
cm.  No focal myometrial masses.  Small cysts in the cervical
region consistent with Nabothian cysts.

Endometrium:  Endometrial stripe thickness measures 6.8 mm. No
defined intrauterine gestational sac.  Small amount of fluid in the
endometrial canal. The

Right ovary: Right ovary measures 2.2 x 1.3 x 1.9 cm.  Normal
follicular changes.  No abnormal adnexal masses.  Flow is
demonstrated in the ovary on color flow Doppler imaging.

Left ovary:   Left ovary measures 3.4 x 1.4 x 1.6 cm.  Normal
follicular changes.  No abnormal adnexal masses.  Flow is
demonstrated in the ovary on color flow Doppler imaging.

Pulsed Doppler evaluation demonstrates normal low-resistance
arterial and venous waveforms in both ovaries.

No free pelvic fluid collections.
IMPRESSION: Normal endometrial stripe thickness with small amount of
endometrial fluid. No defined intrauterine gestation.  Normal
appearance of the ovaries.

No sonographic evidence for ovarian torsion.

## 2013-11-09 ENCOUNTER — Inpatient Hospital Stay (HOSPITAL_COMMUNITY)
Admission: AD | Admit: 2013-11-09 | Discharge: 2013-11-09 | Disposition: A | Discharge status: Home / Self Care | Payer: Self-pay | Source: Ambulatory Visit | Attending: Obstetrics & Gynecology | Admitting: Obstetrics & Gynecology

## 2013-11-09 ENCOUNTER — Encounter (HOSPITAL_COMMUNITY): Payer: Self-pay

## 2013-11-09 DIAGNOSIS — B3731 Acute candidiasis of vulva and vagina: Secondary | ICD-10-CM | POA: Insufficient documentation

## 2013-11-09 DIAGNOSIS — N949 Unspecified condition associated with female genital organs and menstrual cycle: Secondary | ICD-10-CM | POA: Insufficient documentation

## 2013-11-09 DIAGNOSIS — B373 Candidiasis of vulva and vagina: Secondary | ICD-10-CM | POA: Insufficient documentation

## 2013-11-09 DIAGNOSIS — B372 Candidiasis of skin and nail: Secondary | ICD-10-CM

## 2013-11-09 LAB — URINE MICROSCOPIC-ADD ON

## 2013-11-09 LAB — URINALYSIS, ROUTINE W REFLEX MICROSCOPIC
BILIRUBIN URINE: NEGATIVE
Glucose, UA: NEGATIVE mg/dL
HGB URINE DIPSTICK: NEGATIVE
KETONES UR: NEGATIVE mg/dL
Nitrite: NEGATIVE
Protein, ur: NEGATIVE mg/dL
SPECIFIC GRAVITY, URINE: 1.015 (ref 1.005–1.030)
UROBILINOGEN UA: 0.2 mg/dL (ref 0.0–1.0)
pH: 7 (ref 5.0–8.0)

## 2013-11-09 LAB — POCT PREGNANCY, URINE: Preg Test, Ur: NEGATIVE

## 2013-11-09 LAB — WET PREP, GENITAL
Clue Cells Wet Prep HPF POC: NONE SEEN
Trich, Wet Prep: NONE SEEN
Yeast Wet Prep HPF POC: NONE SEEN

## 2013-11-09 MED ORDER — FLUCONAZOLE 150 MG PO TABS: ORAL_TABLET | ORAL | Status: DC

## 2013-11-09 MED ORDER — TRIAMCINOLONE ACETONIDE 0.1 % EX CREA
1.0000 | TOPICAL_CREAM | Freq: Two times a day (BID) | CUTANEOUS | Status: DC
Start: 2013-11-09 — End: 2014-11-23

## 2013-11-09 MED ORDER — NYSTATIN 100000 UNIT/GM EX CREA: TOPICAL_CREAM | CUTANEOUS | Status: DC

## 2013-11-09 NOTE — Discharge Instructions (Signed)
Candidal Vulvovaginitis °Candidal vulvovaginitis is an infection of the vagina and vulva. The vulva is the skin around the opening of the vagina. This may cause itching and discomfort in and around the vagina.  °HOME CARE °· Only take medicine as told by your doctor. °· Do not have sex (intercourse) until the infection is healed or as told by your doctor. °· Practice safe sex. °· Tell your sex partner about your infection. °· Do not douche or use tampons. °· Wear cotton underwear. Do not wear tight pants or panty hose. °· Eat yogurt. This may help treat and prevent yeast infections. °GET HELP RIGHT AWAY IF:  °· You have a fever. °· Your problems get worse during treatment or do not get better in 3 days. °· You have discomfort, irritation, or itching in your vagina or vulva area. °· You have pain after sex. °· You start to get belly (abdominal) pain. °MAKE SURE YOU: °· Understand these instructions. °· Will watch your condition. °· Will get help right away if you are not doing well or get worse. °Document Released: 05/02/2008 Document Revised: 02/08/2013 Document Reviewed: 05/02/2008 °ExitCare® Patient Information ©2015 [employer]. This information is not intended to replace advice given to you by your health care provider. Make sure you discuss any questions you have with your health care provider. ° °Cutaneous Candidiasis °Cutaneous candidiasis is a condition in which there is an overgrowth of yeast (candida) on the skin. Yeast normally live on the skin, but in small enough numbers not to cause any symptoms. In certain cases, increased growth of the yeast may cause an actual yeast infection. This kind of infection usually occurs in areas of the skin that are constantly warm and moist, such as the armpits or the groin. Yeast is the most common cause of diaper rash in babies and in people who cannot control their bowel movements (incontinence). °CAUSES  °The fungus that most often causes cutaneous candidiasis is  Candida albicans. Conditions that can increase the risk of getting a yeast infection of the skin include: °· Obesity. °· Pregnancy. °· Diabetes. °· Taking antibiotic medicine. °· Taking birth control pills. °· Taking steroid medicines. °· Thyroid disease. °· An iron or zinc deficiency. °· Problems with the immune system. °SYMPTOMS  °· Red, swollen area of the skin. °· Bumps on the skin. °· Itchiness. °DIAGNOSIS  °The diagnosis of cutaneous candidiasis is usually based on its appearance. Light scrapings of the skin may also be taken and viewed under a microscope to identify the presence of yeast. °TREATMENT  °Antifungal creams may be applied to the infected skin. In severe cases, oral medicines may be needed.  °HOME CARE INSTRUCTIONS  °· Keep your skin clean and dry. °· Maintain a healthy weight. °· If you have diabetes, keep your blood sugar under control. °SEEK IMMEDIATE MEDICAL CARE IF: °· Your rash continues to spread despite treatment. °· You have a fever, chills, or abdominal pain. °Document Released: 10/22/2010 Document Revised: 04/28/2011 Document Reviewed: 10/22/2010 °ExitCare® Patient Information ©2015 [employer]. This information is not intended to replace advice given to you by your health care provider. Make sure you discuss any questions you have with your health care provider. ° °

## 2013-11-09 NOTE — MAU Note (Signed)
Vaginal itching, vaginal irritation and pain. Used monostat, symptoms seem to be getting worse and pain is worsening.

## 2013-11-09 NOTE — MAU Provider Note (Signed)
History     CSN: [id number]  Arrival date and time: 11/09/13 2002   First Provider Initiated Contact with Patient 11/09/13 2205      Chief Complaint  Patient presents with  . Vaginal Itching  . Vaginal Pain   HPI Ms. Jane Soto is a 23 y.o. [id number] who presents to MAU today with complaint of vaginal itching, pain and swelling. She states that she had a small amount of thick, white discharge last week and treated herself with OTC Monistat. She states that symptoms have not improved. She denies abdominal pain, vaginal bleeding or fever. She is sexually active and does not use protection.   OB History   Grav Para Term Preterm Abortions TAB SAB Ect Mult Living   0 1 0 1 0 0 2      Past Medical History  Diagnosis Date  . GERD (gastroesophageal reflux disease)     with pregnancy    Past Surgical History  Procedure Laterality Date  . Cesarean section    . Cesarean section N/A 05/02/2013    Procedure: CESAREAN SECTION;  Surgeon: Catalina Antigua, MD;  Location: [facility];  Service: Obstetrics;  Laterality: N/A;    History reviewed. No pertinent family history.  History  Substance Use Topics  . Smoking status: Never Smoker   . Smokeless tobacco: Not on file  . Alcohol Use: No    Allergies: No Known Allergies  Prescriptions prior to admission  Medication Sig Dispense Refill  . levonorgestrel (MIRENA) 20 MCG/24HR IUD 1 each by Intrauterine route once.      . miconazole (MICOTIN) 200 MG vaginal suppository Place 200 mg vaginally at bedtime.      . miconazole (MONISTAT 7) 2 % vaginal cream Place 1 Applicatorful vaginally at bedtime.        Review of Systems  Constitutional: Negative for fever and malaise/fatigue.  Gastrointestinal: Negative for nausea, vomiting, abdominal pain, diarrhea and constipation.  Genitourinary: Negative for dysuria, urgency and frequency.       Neg - vaginal bleeding, discharge   Physical Exam   Blood pressure 122/77, pulse 88,  temperature 98.2 F (36.8 C), temperature source Oral, resp. rate 18, height  (1.575 m), weight 114 lb (51.71 kg), last menstrual period 11/05/2013, SpO2 100.00%, unknown if currently breastfeeding.  Physical Exam  Constitutional: She is oriented to person, place, and time. She appears well-developed and well-nourished. No distress.  HENT:  Head: Normocephalic.  Cardiovascular: Normal rate.   Respiratory: Effort normal.  Genitourinary: There is rash on the right labia. There is rash (moderate erythema of the bilateral labia) on the left labia. No bleeding around the vagina. Vaginal discharge (copious amount of thick, clumpy yellow-white discharge noted) found.  Neurological: She is alert and oriented to person, place, and time.  Skin: Skin is warm and dry. No erythema.  Psychiatric: She has a normal mood and affect.   Results for orders placed during the hospital encounter of 11/09/13 (from the past 24 hour(s))  WET PREP, GENITAL     Status: Abnormal   Collection Time    11/09/13 10:21 PM      Result Value Ref Range   Yeast Wet Prep HPF POC NONE SEEN  NONE SEEN   Trich, Wet Prep NONE SEEN  NONE SEEN   Clue Cells Wet Prep HPF POC NONE SEEN  NONE SEEN   WBC, Wet Prep HPF POC MODERATE (*) NONE SEEN  POCT PREGNANCY, URINE     Status: None  Collection Time    11/09/13 10:34 PM      Result Value Ref Range   Preg Test, Ur NEGATIVE  NEGATIVE    MAU Course  Procedures None  MDM UPT - negative UA, wet prep, GC/Chlamydia and HIV today  Assessment and Plan  A: Yeast vulvovaginitis Cutaneous yeast  P: Discharge home Rx for Nystain, Triamcinolone and Diflucan given to patient Patient may return to MAU as needed or if her condition were to change or worsen   Marny Lowenstein, PA-C  11/09/2013, 10:41 PM

## 2013-11-10 LAB — HIV ANTIBODY (ROUTINE TESTING W REFLEX): HIV 1&2 Ab, 4th Generation: NONREACTIVE

## 2013-11-10 LAB — GC/CHLAMYDIA PROBE AMP
CT PROBE, AMP APTIMA: NEGATIVE
GC PROBE AMP APTIMA: NEGATIVE

## 2013-12-19 ENCOUNTER — Ambulatory Visit (INDEPENDENT_AMBULATORY_CARE_PROVIDER_SITE_OTHER): Payer: Medicaid Other | Admitting: Obstetrics and Gynecology

## 2013-12-19 ENCOUNTER — Encounter: Payer: Self-pay | Admitting: Obstetrics and Gynecology

## 2013-12-19 VITALS — BP 130/83 | HR 59 | Temp 98.3°F | Ht 62.0 in | Wt 110.2 lb

## 2013-12-19 DIAGNOSIS — N898 Other specified noninflammatory disorders of vagina: Secondary | ICD-10-CM

## 2013-12-19 LAB — WET PREP, GENITAL
CLUE CELLS WET PREP: NONE SEEN
Trich, Wet Prep: NONE SEEN
Yeast Wet Prep HPF POC: NONE SEEN

## 2013-12-19 NOTE — Progress Notes (Signed)
Patient ID: [name], female   DOB: 1991-02-15, 23 y.o.   MRN: [id number] 23 yo [id number] presenting today for the evaluation of a non-pruritic and non-malodorous vaginal discharge. Patient report that she was treated recently for a yeast infection. Patient also concerned about skin discoloration in inner thighs which has been present since her pregnancy 7 months ago but never resolved  GENERAL: Well-developed, well-nourished female in no acute distress.  ABDOMEN: Soft, nontender, nondistended. No organomegaly. PELVIC: Normal external female genitalia. Vagina is pink and rugated.  Normal discharge. Normal appearing cervix. Uterus is normal in size. No adnexal mass or tenderness. EXTREMITIES: No cyanosis, clubbing, or edema, 2+ distal pulses.  A/P 23 yo here with vaginal discharge - wet prep collected - Reassurance provided regarding skin discoloration but patient desires referral to dermatology - patient will be contacted with any abnormal results - rtc prn

## 2013-12-20 ENCOUNTER — Telehealth: Payer: Self-pay

## 2013-12-20 NOTE — Telephone Encounter (Signed)
-----   Message from Catalina AntiguaPeggy Constant, MD sent at 12/20/2013 12:56 PM EST ----- Please inform patient of negative wet prep results. Normal vaginal discharge.

## 2013-12-20 NOTE — Telephone Encounter (Signed)
Called patient with interpreter Job FoundsSylvia Sobolvarro. Informed patient of normal results. Patient verbalized understanding. No questions or concerns.

## 2014-02-28 ENCOUNTER — Encounter: Payer: Self-pay | Admitting: Podiatry

## 2014-02-28 ENCOUNTER — Ambulatory Visit (INDEPENDENT_AMBULATORY_CARE_PROVIDER_SITE_OTHER): Payer: BLUE CROSS/BLUE SHIELD | Admitting: Podiatry

## 2014-02-28 VITALS — BP 106/68 | HR 59 | Resp 16

## 2014-02-28 DIAGNOSIS — L603 Nail dystrophy: Secondary | ICD-10-CM

## 2014-02-28 DIAGNOSIS — L600 Ingrowing nail: Secondary | ICD-10-CM

## 2014-02-28 NOTE — Progress Notes (Signed)
   Subjective:    Patient ID: Jane MinionYareli Soto, female    DOB: 06/16/1990, 24 y.o.   MRN: [id number]  HPI Comments: "I have a problem with the toenails"  Patient c/o thick, discolored 1st toenail right for about 1 year. Its starting to itch a lot. She has been using OTC meds. Initially the meds helped, but keeps coming back.     Review of Systems  Skin: Positive for color change and rash.       Change in nails  All other systems reviewed and are negative.      Objective:   Physical Exam: Reviewed her past medical history medications allergies surgeries and social history. Pulses are strongly palpable bilateral. Neurologic sensorium is intact per Semmes-Weinstein monofilament. Deep tendon reflexes are intact bilaterally and muscle strength +5 over 5 dorsiflexion plantar flexors and inverters everters all intrinsic musculature is intact. Orthopedic evaluation demonstrates all joints distal to the ankle before range of motion without crepitation. Mild HAV deformity and hammertoe deformities are noted bilateral. Cutaneous evaluation demonstrates supple well-hydrated cutis multiple areas of callosities secondary to shoe gear irritation particularly around the fifth toes and the DIPJ's of lesser digits. She also has an incurvated nail margin to the tibial border of the hallux right. In this area she also demonstrates thickening of the toenail with discoloration which may be associated with nail dystrophy or onychomycosis.        Assessment & Plan:  Assessment hallux right ingrown nail. Hallux right onychodystrophy rule out onychomycosis.  Plan: A sample of the nail was taken today to determine whether or not it has onychomycosis. If she does not then we will consider matrixectomy.

## 2014-02-28 NOTE — Patient Instructions (Signed)

## 2014-03-21 ENCOUNTER — Encounter: Payer: Self-pay | Admitting: Podiatry

## 2014-03-30 ENCOUNTER — Ambulatory Visit (INDEPENDENT_AMBULATORY_CARE_PROVIDER_SITE_OTHER): Payer: BLUE CROSS/BLUE SHIELD | Admitting: Podiatry

## 2014-03-30 ENCOUNTER — Encounter: Payer: Self-pay | Admitting: Podiatry

## 2014-03-30 VITALS — BP 114/76 | HR 84 | Resp 16

## 2014-03-30 DIAGNOSIS — Z79899 Other long term (current) drug therapy: Secondary | ICD-10-CM

## 2014-03-30 DIAGNOSIS — L603 Nail dystrophy: Secondary | ICD-10-CM

## 2014-03-30 MED ORDER — TERBINAFINE HCL 250 MG PO TABS: 250.0000 mg | ORAL_TABLET | Freq: Every day | ORAL | Status: DC

## 2014-03-30 NOTE — Patient Instructions (Signed)

## 2014-04-01 NOTE — Progress Notes (Signed)
She presents today for positive onychomycosis.  Objective: Onychomycosis.  Assessment: Onychomycosis.  Plan: Started her on Lamisil today 250 mg tablets #30. She will take 1 tablet by mouth daily. We also requested blood work to be performed CBC and a liver profile. Should this come back abnormal I will notify her immediately otherwise I will follow up with her in 1 month at which time more blood work will be performed consisting of a liver profile and a 90 day prescription for Lamisil. Call us with questions or concerns or any signs of reaction.

## 2014-04-26 ENCOUNTER — Other Ambulatory Visit: Payer: Self-pay | Admitting: Podiatry

## 2014-04-27 ENCOUNTER — Ambulatory Visit: Payer: BLUE CROSS/BLUE SHIELD | Admitting: Podiatry

## 2014-05-09 LAB — HEPATIC FUNCTION PANEL
ALBUMIN: 4.8 g/dL (ref 3.5–5.2)
ALT: 8 U/L (ref 0–35)
AST: 13 U/L (ref 0–37)
Alkaline Phosphatase: 61 U/L (ref 39–117)
BILIRUBIN TOTAL: 1.4 mg/dL — AB (ref 0.2–1.2)
Bilirubin, Direct: 0.3 mg/dL (ref 0.0–0.3)
Indirect Bilirubin: 1.1 mg/dL (ref 0.2–1.2)
Total Protein: 7.2 g/dL (ref 6.0–8.3)

## 2014-05-10 NOTE — Progress Notes (Signed)
Called patient L/M labs WNL and can continue medication.

## 2014-05-11 ENCOUNTER — Encounter: Payer: Self-pay | Admitting: Podiatry

## 2014-05-11 ENCOUNTER — Ambulatory Visit (INDEPENDENT_AMBULATORY_CARE_PROVIDER_SITE_OTHER): Payer: BLUE CROSS/BLUE SHIELD | Admitting: Podiatry

## 2014-05-11 DIAGNOSIS — Z79899 Other long term (current) drug therapy: Secondary | ICD-10-CM | POA: Diagnosis not present

## 2014-05-11 DIAGNOSIS — L603 Nail dystrophy: Secondary | ICD-10-CM

## 2014-05-11 MED ORDER — TERBINAFINE HCL 250 MG PO TABS: 250.0000 mg | ORAL_TABLET | Freq: Every day | ORAL | Status: DC

## 2014-05-11 NOTE — Progress Notes (Signed)
She presents today for follow-up of her Lamisil therapy. She denies fever chills nausea vomiting muscle aches pains. He states that there is been no change to her nail plates yet. She denies any itching or rashes.  Objective: Onychomycosis bilateral.  Assessment: Onychomycosis long-term therapy.  Plan: Requested a liver profile and CBC. I also dispensed a prescription for Lamisil 250 mg tablets #90 no refills and I will follow-up with her in 4 months. Should her blood work come back abnormal we will notify her immediately.

## 2014-09-12 ENCOUNTER — Ambulatory Visit: Payer: BLUE CROSS/BLUE SHIELD | Admitting: Podiatry

## 2014-11-23 ENCOUNTER — Encounter: Payer: Self-pay | Admitting: Neurology

## 2014-11-23 ENCOUNTER — Ambulatory Visit (INDEPENDENT_AMBULATORY_CARE_PROVIDER_SITE_OTHER): Payer: BLUE CROSS/BLUE SHIELD | Admitting: Neurology

## 2014-11-23 VITALS — BP 111/77 | HR 77 | Ht 62.0 in | Wt 105.0 lb

## 2014-11-23 DIAGNOSIS — R51 Headache: Secondary | ICD-10-CM | POA: Diagnosis not present

## 2014-11-23 DIAGNOSIS — R519 Headache, unspecified: Secondary | ICD-10-CM

## 2014-11-23 DIAGNOSIS — S060X1S Concussion with loss of consciousness of 30 minutes or less, sequela: Secondary | ICD-10-CM

## 2014-11-23 DIAGNOSIS — M542 Cervicalgia: Secondary | ICD-10-CM

## 2014-11-23 MED ORDER — NORTRIPTYLINE HCL 10 MG PO CAPS: ORAL_CAPSULE | ORAL | Status: DC

## 2014-11-23 MED ORDER — RIZATRIPTAN BENZOATE 5 MG PO TBDP: 5.0000 mg | ORAL_TABLET | ORAL | Status: DC | PRN

## 2014-11-23 MED ORDER — TIZANIDINE HCL 2 MG PO TABS: 2.0000 mg | ORAL_TABLET | Freq: Three times a day (TID) | ORAL | Status: DC | PRN
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
## 2014-11-24 ENCOUNTER — Telehealth: Payer: Self-pay | Admitting: *Deleted

## 2014-11-24 NOTE — Telephone Encounter (Signed)
Release faxed to [facility] requesting office notes and films.

## 2014-11-28 ENCOUNTER — Encounter: Payer: Self-pay | Admitting: Obstetrics & Gynecology

## 2014-11-28 ENCOUNTER — Ambulatory Visit (INDEPENDENT_AMBULATORY_CARE_PROVIDER_SITE_OTHER): Payer: BLUE CROSS/BLUE SHIELD | Admitting: Obstetrics & Gynecology

## 2014-11-28 VITALS — BP 106/72 | HR 66 | Wt 106.0 lb

## 2014-11-28 DIAGNOSIS — R112 Nausea with vomiting, unspecified: Secondary | ICD-10-CM

## 2014-11-28 DIAGNOSIS — R1031 Right lower quadrant pain: Secondary | ICD-10-CM

## 2014-11-28 DIAGNOSIS — R634 Abnormal weight loss: Secondary | ICD-10-CM | POA: Diagnosis not present

## 2014-11-28 NOTE — Patient Instructions (Signed)
Return to clinic for any scheduled appointments or for any gynecologic concerns as needed.   
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
## 2014-11-29 ENCOUNTER — Ambulatory Visit
Admission: RE | Admit: 2014-11-29 | Discharge: 2014-11-29 | Disposition: A | Discharge status: Home / Self Care | Payer: BLUE CROSS/BLUE SHIELD | Source: Ambulatory Visit | Attending: Obstetrics & Gynecology | Admitting: Obstetrics & Gynecology

## 2014-11-29 DIAGNOSIS — R1031 Right lower quadrant pain: Secondary | ICD-10-CM

## 2014-11-29 MED ORDER — IOPAMIDOL (ISOVUE-300) INJECTION 61%
100.0000 mL | Freq: Once | INTRAVENOUS | Status: AC | PRN
  Administered 2014-11-29: 100 mL via INTRAVENOUS

## 2014-12-26 ENCOUNTER — Encounter: Payer: Self-pay | Admitting: Neurology

## 2014-12-26 ENCOUNTER — Ambulatory Visit (INDEPENDENT_AMBULATORY_CARE_PROVIDER_SITE_OTHER): Payer: BLUE CROSS/BLUE SHIELD | Admitting: Neurology

## 2014-12-26 VITALS — BP 109/80 | HR 68 | Ht 62.0 in | Wt 106.0 lb

## 2014-12-26 DIAGNOSIS — G4700 Insomnia, unspecified: Secondary | ICD-10-CM | POA: Diagnosis not present

## 2014-12-26 DIAGNOSIS — Z87828 Personal history of other (healed) physical injury and trauma: Secondary | ICD-10-CM | POA: Diagnosis not present

## 2014-12-26 DIAGNOSIS — IMO0002 Reserved for concepts with insufficient information to code with codable children: Secondary | ICD-10-CM | POA: Insufficient documentation

## 2014-12-26 DIAGNOSIS — G43709 Chronic migraine without aura, not intractable, without status migrainosus: Secondary | ICD-10-CM | POA: Diagnosis not present

## 2014-12-26 MED ORDER — NORTRIPTYLINE HCL 50 MG PO CAPS: 50.0000 mg | ORAL_CAPSULE | Freq: Every day | ORAL | Status: DC
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
## 2014-12-26 NOTE — Addendum Note (Signed)
Addended by: Levert FeinsteinYAN, Zaydrian Batta on: 12/26/2014 01:20 PM   Modules accepted: Level of Service

## 2015-01-27 ENCOUNTER — Other Ambulatory Visit: Payer: Self-pay | Admitting: Medical

## 2015-01-29 ENCOUNTER — Telehealth: Payer: Self-pay | Admitting: *Deleted

## 2015-01-29 DIAGNOSIS — B379 Candidiasis, unspecified: Secondary | ICD-10-CM

## 2015-01-29 MED ORDER — FLUCONAZOLE 150 MG PO TABS
150.0000 mg | ORAL_TABLET | Freq: Once | ORAL | Status: DC
Start: 2015-01-29 — End: 2015-02-06

## 2015-01-29 NOTE — Telephone Encounter (Signed)
Pt called requesting rx for yeast infection. Diflucan sent in per protocol.

## 2015-02-06 ENCOUNTER — Encounter: Payer: Self-pay | Admitting: Advanced Practice Midwife

## 2015-02-06 ENCOUNTER — Ambulatory Visit (INDEPENDENT_AMBULATORY_CARE_PROVIDER_SITE_OTHER): Payer: BLUE CROSS/BLUE SHIELD | Admitting: Advanced Practice Midwife

## 2015-02-06 VITALS — BP 117/81 | HR 56 | Temp 98.1°F | Ht 61.0 in | Wt 109.0 lb

## 2015-02-06 DIAGNOSIS — Z124 Encounter for screening for malignant neoplasm of cervix: Secondary | ICD-10-CM

## 2015-02-06 DIAGNOSIS — N730 Acute parametritis and pelvic cellulitis: Secondary | ICD-10-CM

## 2015-02-06 DIAGNOSIS — Z1151 Encounter for screening for human papillomavirus (HPV): Secondary | ICD-10-CM

## 2015-02-06 DIAGNOSIS — Z113 Encounter for screening for infections with a predominantly sexual mode of transmission: Secondary | ICD-10-CM

## 2015-02-06 DIAGNOSIS — A64 Unspecified sexually transmitted disease: Secondary | ICD-10-CM

## 2015-02-06 MED ORDER — CEFTRIAXONE SODIUM 250 MG IJ SOLR: 250.0000 mg | Freq: Once | INTRAMUSCULAR | Status: DC

## 2015-02-06 MED ORDER — AZITHROMYCIN 500 MG PO TABS: 1000.0000 mg | ORAL_TABLET | Freq: Once | ORAL | Status: DC

## 2015-02-06 MED ORDER — CEFTRIAXONE SODIUM 1 G IJ SOLR
250.0000 mg | Freq: Once | INTRAMUSCULAR | Status: AC
  Administered 2015-02-06: 250 mg via INTRAMUSCULAR

## 2015-02-06 MED ORDER — FLUCONAZOLE 150 MG PO TABS: 150.0000 mg | ORAL_TABLET | Freq: Once | ORAL | Status: DC

## 2015-02-06 MED ORDER — METRONIDAZOLE 500 MG PO TABS: 500.0000 mg | ORAL_TABLET | Freq: Two times a day (BID) | ORAL | Status: DC
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
## 2015-02-06 NOTE — Patient Instructions (Signed)

## 2015-02-07 LAB — WET PREP, GENITAL
TRICH WET PREP: NONE SEEN
WBC WET PREP: NONE SEEN
Yeast Wet Prep HPF POC: NONE SEEN

## 2015-02-07 LAB — HIV ANTIBODY (ROUTINE TESTING W REFLEX): HIV 1&2 Ab, 4th Generation: NONREACTIVE

## 2015-02-07 LAB — RPR

## 2015-02-07 LAB — HEPATITIS B SURFACE ANTIGEN: Hepatitis B Surface Ag: NEGATIVE

## 2015-02-08 LAB — GC/CHLAMYDIA PROBE AMP (~~LOC~~) NOT AT ARMC
CHLAMYDIA, DNA PROBE: NEGATIVE
NEISSERIA GONORRHEA: NEGATIVE

## 2015-02-08 LAB — CYTOLOGY - PAP

## 2015-02-13 ENCOUNTER — Encounter: Payer: Self-pay | Admitting: Advanced Practice Midwife

## 2015-02-13 DIAGNOSIS — N871 Moderate cervical dysplasia: Secondary | ICD-10-CM | POA: Insufficient documentation

## 2015-02-14 ENCOUNTER — Telehealth: Payer: Self-pay | Admitting: General Practice

## 2015-02-14 NOTE — Telephone Encounter (Signed)
Per Wynelle BourgeoisMarie Soto, patient's pap shows ASCUS with + HRHPV and needs colpo. colpo scheduled for 1/23 @ 215. Called patient, no answer- left message stating we are trying to reach you with results, please call us back at the clinics.

## 2015-02-15 NOTE — Telephone Encounter (Signed)
Called pt and informed pt of pap smear results and the need for a colpo.  I explained to the pt what a colposcopy is, +HPV, and what abnormal pap smear means.  Pt stated understanding and that she will be at her appointment scheduled for 03/11/14 @ 215.

## 2015-02-18 HISTORY — PX: HAND SURGERY: SHX662

## 2015-03-12 ENCOUNTER — Ambulatory Visit (INDEPENDENT_AMBULATORY_CARE_PROVIDER_SITE_OTHER): Payer: Medicaid Other | Admitting: Obstetrics and Gynecology

## 2015-03-12 DIAGNOSIS — R87811 Vaginal high risk human papillomavirus (HPV) DNA test positive: Principal | ICD-10-CM

## 2015-03-12 DIAGNOSIS — R87620 Atypical squamous cells of undetermined significance on cytologic smear of vagina (ASC-US): Secondary | ICD-10-CM

## 2015-03-12 DIAGNOSIS — Z3202 Encounter for pregnancy test, result negative: Secondary | ICD-10-CM

## 2015-03-12 LAB — POCT PREGNANCY, URINE: Preg Test, Ur: NEGATIVE

## 2015-03-12 NOTE — Progress Notes (Signed)
Patient ID: Jane Soto, female   DOB: 12/19/1990, 25 y.o.   MRN: [id number] Patient with ASC-H on pap smear scheduled for colposcopy today. Patient left without being seen as she was not able to wait any further. She was instructed to follow up with [facility] first for financial assistance. Her colposcopy will be re-scheduled

## 2015-03-16 ENCOUNTER — Ambulatory Visit (HOSPITAL_COMMUNITY)
Admission: RE | Admit: 2015-03-16 | Discharge: 2015-03-16 | Disposition: A | Discharge status: Home / Self Care | Payer: Medicaid Other | Source: Ambulatory Visit | Attending: Obstetrics and Gynecology | Admitting: Obstetrics and Gynecology

## 2015-03-16 ENCOUNTER — Encounter (HOSPITAL_COMMUNITY): Payer: Self-pay

## 2015-03-16 VITALS — BP 102/60 | Temp 97.5°F | Ht 61.0 in | Wt 109.0 lb

## 2015-03-16 DIAGNOSIS — R87610 Atypical squamous cells of undetermined significance on cytologic smear of cervix (ASC-US): Secondary | ICD-10-CM

## 2015-03-16 DIAGNOSIS — Z1239 Encounter for other screening for malignant neoplasm of breast: Secondary | ICD-10-CM

## 2015-03-16 DIAGNOSIS — R87810 Cervical high risk human papillomavirus (HPV) DNA test positive: Secondary | ICD-10-CM

## 2015-03-16 NOTE — Progress Notes (Signed)
Patient referred to [facility] by the [facility] for a colposcopy to follow up for an abnormal Pap smear on 02/06/2015.  Pap Smear: Pap smear not completed today. Last Pap smear was 02/06/2015 at the [facility] and ASC-H with positive HPV. Referred patient to the [facility] for a colposcopy. Appointment scheduled for Friday, March 30, 2015 at 0900.  Per patient has no history of abnormal Pap smears prior to the most recent Pap smear. Pap smear result is in EPIC.  Physical exam: Breasts Breasts symmetrical. No skin abnormalities bilateral breasts. No nipple retraction bilateral breasts. No nipple discharge bilateral breasts. No lymphadenopathy. No lumps palpated bilateral breasts. No complaints of pain or tenderness on exam. Screening mammogram recommended at age 30 unless clinically indicated prior.   Pelvic/Bimanual No Pap smear completed today since last Pap smear was 02/06/2015. Pap smear not indicated per [facility] guidelines.   Smoking History: Patient has never smoked.  Patient Navigation: Patient education provided. Access to services provided for patient through [facility] program.

## 2015-03-16 NOTE — Patient Instructions (Signed)
Educational materials on breast self awareness given. Explained the colposcopy to Jane Soto the recommended follow up for her abnormal Pap smear on 02/06/2015. Referred patient to the [facility] for a colposcopy. Appointment scheduled for Friday, March 30, 2015 at 0900. Patient aware of appointment and will be there. Let patient know she will need a screening mammogram at age 25 unless clinically indicated prior. [name] verbalized understanding.  Billiejean Schimek, Kathaleen Maser, RN 9:20 AM

## 2015-03-19 ENCOUNTER — Encounter (HOSPITAL_COMMUNITY): Payer: Self-pay | Admitting: *Deleted

## 2015-03-30 ENCOUNTER — Ambulatory Visit (INDEPENDENT_AMBULATORY_CARE_PROVIDER_SITE_OTHER): Payer: BLUE CROSS/BLUE SHIELD | Admitting: Obstetrics & Gynecology

## 2015-03-30 ENCOUNTER — Encounter: Payer: Self-pay | Admitting: Obstetrics & Gynecology

## 2015-03-30 ENCOUNTER — Other Ambulatory Visit (HOSPITAL_COMMUNITY)
Admission: RE | Admit: 2015-03-30 | Discharge: 2015-03-30 | Disposition: A | Discharge status: Home / Self Care | Payer: Self-pay | Source: Ambulatory Visit | Attending: Obstetrics & Gynecology | Admitting: Obstetrics & Gynecology

## 2015-03-30 VITALS — BP 114/78 | HR 70 | Temp 98.1°F | Ht 64.0 in | Wt 105.5 lb

## 2015-03-30 DIAGNOSIS — R87610 Atypical squamous cells of undetermined significance on cytologic smear of cervix (ASC-US): Secondary | ICD-10-CM

## 2015-03-30 DIAGNOSIS — R87810 Cervical high risk human papillomavirus (HPV) DNA test positive: Secondary | ICD-10-CM | POA: Diagnosis not present

## 2015-03-30 DIAGNOSIS — Z23 Encounter for immunization: Secondary | ICD-10-CM | POA: Diagnosis not present

## 2015-03-30 DIAGNOSIS — Z3202 Encounter for pregnancy test, result negative: Secondary | ICD-10-CM

## 2015-03-30 LAB — POCT PREGNANCY, URINE: Preg Test, Ur: NEGATIVE

## 2015-03-30 NOTE — Patient Instructions (Signed)
Colposcopy Colposcopy is a procedure to examine your cervix and vagina, or the area around the outside of your vagina, for abnormalities or signs of disease. The procedure is done using a lighted microscope called a colposcope. Tissue samples may be collected during the colposcopy if your health care provider finds any unusual cells. A colposcopy may be done if a woman has:  An abnormal Pap test. A Pap test is a medical test done to evaluate cells that are on the surface of the cervix.  A Pap test result that is suggestive of human papillomavirus (HPV). This virus can cause genital warts and is linked to the development of cervical cancer.  A sore on her cervix and the results of a Pap test were normal.  Genital warts on the cervix or in or around the outside of the vagina.  A mother who took the drug diethylstilbestrol (DES) while pregnant.  Painful intercourse.  Vaginal bleeding, especially after sexual intercourse. LET YOUR HEALTH CARE PROVIDER KNOW ABOUT:  Any allergies you have.  All medicines you are taking, including vitamins, herbs, eye drops, creams, and over-the-counter medicines.  Previous problems you or members of your family have had with the use of anesthetics.  Any blood disorders you have.  Previous surgeries you have had.  Medical conditions you have. RISKS AND COMPLICATIONS Generally, a colposcopy is a safe procedure. However, as with any procedure, complications can occur. Possible complications include:  Bleeding.  Infection.  Missed lesions. BEFORE THE PROCEDURE   Tell your health care provider if you have your menstrual period. A colposcopy typically is not done during menstruation.  For 24 hours before the colposcopy, do not:  Douche.  Use tampons.  Use medicines, creams, or suppositories in the vagina.  Have sexual intercourse. PROCEDURE  During the procedure, you will be lying on your back with your feet in foot rests (stirrups). A warm  metal or plastic instrument (speculum) will be placed in your vagina to keep it open and to allow the health care provider to see the cervix. The colposcope will be placed outside the vagina. It will be used to magnify and examine the cervix, vagina, and the area around the outside of the vagina. A small amount of liquid solution will be placed on the area that is to be viewed. This solution will make it easier to see the abnormal cells. Your health care provider will use tools to suck out mucus and cells from the canal of the cervix. Then he or she will record the location of the abnormal areas. If a biopsy is done during the procedure, a medicine will usually be given to numb the area (local anesthetic). You may feel mild pain or cramping while the biopsy is done. After the procedure, tissue samples collected during the biopsy will be sent to a lab for analysis. AFTER THE PROCEDURE  You will be given instructions on when to follow up with your health care provider for your test results. It is important to keep your appointment.   This information is not intended to replace advice given to you by your health care provider. Make sure you discuss any questions you have with your health care provider.   Document Released: 04/26/2002 Document Revised: 10/06/2012 Document Reviewed: 09/02/2012 Elsevier Interactive Patient Education 2016 Elsevier Inc. Colposcopy, Care After Refer to this sheet in the next few weeks. These instructions provide you with information on caring for yourself after your procedure. Your health care provider may also give you   more specific instructions. Your treatment has been planned according to current medical practices, but problems sometimes occur. Call your health care provider if you have any problems or questions after your procedure. WHAT TO EXPECT AFTER THE PROCEDURE  After your procedure, it is typical to have the following:  Cramping. This often goes away in a few  minutes.  Soreness. This may last for 2 days.  Lightheadedness. Lie down for a few minutes if this occurs. You may also have some bleeding or dark discharge for a few days. You may need to wear a sanitary pad during this time. HOME CARE INSTRUCTIONS  Avoid sex, douching, and using tampons for 3 days or as directed by your health care provider.  Only take over-the-counter or prescription medicines as directed by your health care provider. Do not take aspirin because it can cause bleeding.  Continue to take birth control pills if you are on them.  Not all test results are available during your visit. If your test results are not back during the visit, make an appointment with your health care provider to find out the results. Do not assume everything is normal if you have not heard from your health care provider or the medical facility. It is important for you to follow up on all of your test results.  Follow your health care provider's advice regarding activity, follow-up visits, and follow-up Pap tests. SEEK MEDICAL CARE IF:  You develop a rash.  You have problems with your medicine. SEEK IMMEDIATE MEDICAL CARE IF:  You are bleeding heavily or are passing blood clots.  You have a fever.  You have abnormal vaginal discharge.  You are having cramps that do not go away after taking your pain medicine.  You feel lightheaded, dizzy, or faint.  You have stomach pain.   This information is not intended to replace advice given to you by your health care provider. Make sure you discuss any questions you have with your health care provider.   Document Released: 11/24/2012 Document Reviewed: 11/24/2012 Elsevier Interactive Patient Education 2016 Elsevier Inc.  

## 2015-03-30 NOTE — Progress Notes (Signed)
Patient ID: Jane Soto, female   DOB: 12-06-1990, 25 y.o.   MRN: [id number] Patient given informed consent, signed copy in the chart, time out was performed.  Placed in lithotomy position. Cervix viewed with speculum and colposcope after application of acetic acid.  02/06/2015 Adequacy Reason Satisfactory for evaluation, endocervical/transformation zone component PRESENT. Diagnosis ATYPICAL SQUAMOUS CELLS CANNOT EXCLUDE A HIGH GRADE SQUAMOUS INTRAEPITHELIAL LESION (ASC-H). Pecola Leisure MD Pathologist, Electronic Signature (Case signed 02/09/2015) Source CervicoVaginal Pap [ThinPrep Imaged] Molecular Results Test Result HPV High Risk DETECTED  Colposcopy adequate?  yes Acetowhite lesions?yes Punctation?no Mosaicism?  No Abnormal vasculature?  no Biopsies?yes ECC?no  Patient was given post procedure instructions.  She will be called within 2 weeks for results.  Pt wants to begin HPV vaccine series.  Dominik Lauricella L. Harraway-Smith, M.D., [place]

## 2015-04-02 NOTE — Addendum Note (Signed)
Addended by: Faythe Casa on: 04/02/2015 08:50 AM   Modules accepted: Orders

## 2015-04-06 ENCOUNTER — Telehealth: Payer: Self-pay | Admitting: *Deleted

## 2015-04-06 NOTE — Telephone Encounter (Signed)
Pt called for results. Called her back and heard message that her phone is not accepting calls.

## 2015-04-09 ENCOUNTER — Telehealth: Payer: Self-pay | Admitting: *Deleted

## 2015-04-09 NOTE — Telephone Encounter (Signed)
[name] called and left a message she is calling today for results and other issues.

## 2015-04-09 NOTE — Telephone Encounter (Addendum)
Called Jane Soto back and informed her results have not been reviewed by her doctor and once we hear from her - we will call her back.  She also states she is having the same smell and feeling uncomfortable in vaginal area as she was in December when they did std tests and were negative, but treated her anyways. Per notes was concern for std/ PID.  I advised [name] we should have her come in for a visit to see if she has BV , yeast or std. Will have registar call her with appointment.

## 2015-04-10 NOTE — Telephone Encounter (Signed)
Per Dr. Erin Fulling patient needs a LEEP her colpo showed a high grade lesion. Leep scheduled for 05/09/15.

## 2015-04-10 NOTE — Telephone Encounter (Signed)
Please see prior note to schedule pt for LEEP.  clh-S

## 2015-04-12 NOTE — Telephone Encounter (Signed)
Called patient, no answer- unable to leave message 

## 2015-04-16 ENCOUNTER — Encounter: Payer: Self-pay | Admitting: *Deleted

## 2015-04-16 NOTE — Telephone Encounter (Signed)
Called [facility] and was unable to leave a message. I heard a message from [employer] this customer is not available to take calls. Will send letter.

## 2015-04-17 ENCOUNTER — Telehealth: Payer: Self-pay | Admitting: *Deleted

## 2015-04-17 NOTE — Telephone Encounter (Signed)
Pt called and stated that she would like more information about the LEEP Procedure that has been scheduled for her.

## 2015-04-18 NOTE — Telephone Encounter (Signed)
Spoke with patient concerning her leep procedure and what all would happened during the visit.

## 2015-04-20 ENCOUNTER — Ambulatory Visit (INDEPENDENT_AMBULATORY_CARE_PROVIDER_SITE_OTHER): Payer: BLUE CROSS/BLUE SHIELD | Admitting: Obstetrics & Gynecology

## 2015-04-20 ENCOUNTER — Encounter: Payer: Self-pay | Admitting: Obstetrics & Gynecology

## 2015-04-20 VITALS — BP 124/76 | HR 58 | Temp 98.2°F | Wt 105.2 lb

## 2015-04-20 DIAGNOSIS — R300 Dysuria: Secondary | ICD-10-CM | POA: Diagnosis not present

## 2015-04-20 DIAGNOSIS — N898 Other specified noninflammatory disorders of vagina: Secondary | ICD-10-CM

## 2015-04-20 LAB — POCT URINALYSIS DIP (DEVICE)
BILIRUBIN URINE: NEGATIVE
Glucose, UA: NEGATIVE mg/dL
Ketones, ur: NEGATIVE mg/dL
NITRITE: POSITIVE — AB
PH: 5.5 (ref 5.0–8.0)
PROTEIN: 100 mg/dL — AB
Specific Gravity, Urine: >=1.03 (ref 1.005–1.030)
UROBILINOGEN UA: 0.2 mg/dL (ref 0.0–1.0)

## 2015-04-20 LAB — WET PREP, GENITAL: TRICH WET PREP: NONE SEEN

## 2015-04-20 MED ORDER — SULFAMETHOXAZOLE-TRIMETHOPRIM 800-160 MG PO TABS: 1.0000 | ORAL_TABLET | Freq: Two times a day (BID) | ORAL | Status: DC

## 2015-04-20 MED ORDER — FLAVOXATE HCL 100 MG PO TABS: 100.0000 mg | ORAL_TABLET | Freq: Three times a day (TID) | ORAL | Status: DC | PRN

## 2015-04-20 MED ORDER — FLUCONAZOLE 150 MG PO TABS: 150.0000 mg | ORAL_TABLET | Freq: Once | ORAL | Status: DC

## 2015-04-20 NOTE — Progress Notes (Signed)
   Subjective:    Patient ID: Jane Soto, female    DOB: [birth year], 25 y.o.   MRN: [id number]  HPI 25 yo H woman here with the complaint of a vaginal discharge and dysuria and urinary frequency.   Review of Systems     Objective:   Physical Exam  Thin, somewhat distressed HDF Abd- benign Speculum exam- IUD strings seen, large amount of clumpy yellow discharge U/a c/w UTI      Assessment & Plan:  Vaginal d/c c/w yeast- wet prep sent. Treat with diflucan (refills x 3 given) Dysuria- send uc&s. Treat with urispas and bactrim ds

## 2015-04-22 LAB — URINE CULTURE: Colony Count: >=100000

## 2015-05-09 ENCOUNTER — Encounter: Payer: Self-pay | Admitting: Obstetrics & Gynecology

## 2015-05-21 ENCOUNTER — Telehealth: Payer: Self-pay | Admitting: General Practice

## 2015-05-21 DIAGNOSIS — N760 Acute vaginitis: Principal | ICD-10-CM

## 2015-05-21 DIAGNOSIS — B9689 Other specified bacterial agents as the cause of diseases classified elsewhere: Secondary | ICD-10-CM

## 2015-05-21 MED ORDER — METRONIDAZOLE 500 MG PO TABS: 500.0000 mg | ORAL_TABLET | Freq: Two times a day (BID) | ORAL | Status: DC

## 2015-05-21 NOTE — Telephone Encounter (Signed)
Patient called and left message stating she was recently treated for a yeast infection and it isn't working and she would like a different medication. Called patient and she states she is still having a discharge with odor and has taken the yeast infection medication twice now. Informed patient that wet prep also shows BV and that we would send an antibiotic to her pharmacy. Patient verbalized understanding & states she has had this problem off and on since December and doesn't know what to do. Explained difference between yeast infection and BV and causes of each. Explained prevention methods of BV and also discussed that some women are more prone to infections and we are unsure why that is. Patient verbalized understanding to all & will pick up medication. Patient had no other questions

## 2015-05-25 ENCOUNTER — Ambulatory Visit (INDEPENDENT_AMBULATORY_CARE_PROVIDER_SITE_OTHER): Payer: BLUE CROSS/BLUE SHIELD | Admitting: *Deleted

## 2015-05-25 VITALS — BP 101/62 | HR 66 | Temp 98.2°F | Wt 107.0 lb

## 2015-05-25 DIAGNOSIS — Z23 Encounter for immunization: Secondary | ICD-10-CM

## 2015-06-13 ENCOUNTER — Encounter: Payer: Self-pay | Admitting: Obstetrics & Gynecology

## 2015-06-19 ENCOUNTER — Telehealth: Payer: Self-pay

## 2015-06-19 ENCOUNTER — Telehealth (HOSPITAL_COMMUNITY): Payer: Self-pay | Admitting: *Deleted

## 2015-06-19 NOTE — Telephone Encounter (Signed)
Pt called and stated that she wanted to discuss her symptoms.

## 2015-06-19 NOTE — Telephone Encounter (Signed)
Telephoned patient at home # and left message to return call to [facility]. Used interpreter Julie Sowell. 

## 2015-06-20 NOTE — Telephone Encounter (Signed)
Jane Soto left a message stating she wants to talk to someone about results.  I called her back and she wanted to review her std results from December and wet prep from recently.  She  States she is still having a yellow discharge, no odor and she is worried she has something else going on . We discussed she has been treated for yeast and BV recently and if continues to have chronic infections she needs to see A provider. She agreed to an appointment and call transferred to registar.

## 2015-06-25 ENCOUNTER — Other Ambulatory Visit: Payer: Self-pay | Admitting: Family Medicine

## 2015-06-26 ENCOUNTER — Ambulatory Visit (INDEPENDENT_AMBULATORY_CARE_PROVIDER_SITE_OTHER): Payer: BLUE CROSS/BLUE SHIELD | Admitting: Neurology

## 2015-06-26 ENCOUNTER — Encounter: Payer: Self-pay | Admitting: Neurology

## 2015-06-26 VITALS — BP 108/72 | HR 76 | Ht 64.0 in | Wt 106.8 lb

## 2015-06-26 DIAGNOSIS — G43709 Chronic migraine without aura, not intractable, without status migrainosus: Secondary | ICD-10-CM | POA: Diagnosis not present

## 2015-06-26 DIAGNOSIS — G4700 Insomnia, unspecified: Secondary | ICD-10-CM

## 2015-06-26 DIAGNOSIS — IMO0002 Reserved for concepts with insufficient information to code with codable children: Secondary | ICD-10-CM

## 2015-06-26 MED ORDER — NORTRIPTYLINE HCL 25 MG PO CAPS: 50.0000 mg | ORAL_CAPSULE | Freq: Every day | ORAL | Status: DC
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
## 2015-07-09 ENCOUNTER — Ambulatory Visit (INDEPENDENT_AMBULATORY_CARE_PROVIDER_SITE_OTHER): Payer: BLUE CROSS/BLUE SHIELD | Admitting: Obstetrics & Gynecology

## 2015-07-09 ENCOUNTER — Encounter: Payer: Self-pay | Admitting: Obstetrics & Gynecology

## 2015-07-09 VITALS — BP 126/90 | HR 67 | Wt 104.8 lb

## 2015-07-09 DIAGNOSIS — N871 Moderate cervical dysplasia: Secondary | ICD-10-CM

## 2015-07-09 DIAGNOSIS — N898 Other specified noninflammatory disorders of vagina: Secondary | ICD-10-CM

## 2015-07-09 DIAGNOSIS — Z113 Encounter for screening for infections with a predominantly sexual mode of transmission: Secondary | ICD-10-CM | POA: Diagnosis not present

## 2015-07-09 DIAGNOSIS — N766 Ulceration of vulva: Secondary | ICD-10-CM

## 2015-07-09 MED ORDER — VALACYCLOVIR HCL 1 G PO TABS: 1000.0000 mg | ORAL_TABLET | Freq: Two times a day (BID) | ORAL | Status: DC

## 2015-07-09 NOTE — Patient Instructions (Addendum)
Genital Herpes Genital herpes is a common sexually transmitted infection (STI) that is caused by a virus. The virus is spread from person to person through sexual contact. Infection can cause itching, blisters, and sores in the genital area or rectal area. This is called an outbreak. It affects both men and women. Genital herpes is particularly concerning for pregnant women because the virus can be passed to the baby during delivery and cause serious problems. Genital herpes is also a concern for people with a weakened defense (immune) system. Symptoms of genital herpes may last several days and then go away. However, the virus remains in your body, so you may have more outbreaks of symptoms in the future. The time between outbreaks varies and can be months or years. CAUSES Genital herpes is caused by a virus called herpes simplex virus (HSV) type 2 or HSV type 1. These viruses are contagious and are most often spread through sexual contact with an infected person. Sexual contact includes vaginal, anal, and oral sex. RISK FACTORS Risk factors for genital herpes include:  Being sexually active with multiple partners.  Having unprotected sex. SIGNS AND SYMPTOMS Symptoms may include:  Pain and itching in the genital area or rectal area.  Small red bumps that turn into blisters and then turn into sores.  Flu-like symptoms, including:  Fever.  Body aches.  Painful urination.  Vaginal discharge. DIAGNOSIS Genital herpes may be diagnosed by:  Physical exam.  Blood test.  Fluid culture test from an open sore. TREATMENT There is no cure for genital herpes. Oral antiviral medicines may be used to speed up healing and to help prevent the return of symptoms. These medicines can also help to reduce the spread of the virus to sexual partners. HOME CARE INSTRUCTIONS  Keep the affected areas dry and clean.  Take medicines only as directed by your health care provider.  Do not have sexual  contact during active infections. Genital herpes is contagious.  Practice safe sex. Latex condoms and female condoms may help to prevent the spread of the herpes virus.  Avoid rubbing or touching the blisters and sores. If you do touch the blister or sores:  Wash your hands thoroughly.  Do not touch your eyes afterward.  If you become pregnant, tell your health care provider if you have had genital herpes.  Keep all follow-up visits as directed by your health care provider. This is important. PREVENTION  Use condoms. Although anyone can contract genital herpes during sexual contact even with the use of a condom, a condom can provide some protection.  Avoid having multiple sexual partners.  Talk to your sexual partner about any symptoms and past history that either of you may have.  Get tested before you have sex. Ask your partner to do the same.  Recognize the symptoms of genital herpes. Do not have sexual contact if you notice these symptoms. SEEK MEDICAL CARE IF:  Your symptoms are not improving with medicine.  Your symptoms return.  You have new symptoms.  You have a fever.  You have abdominal pain.  You have redness, swelling, or pain in your eye. MAKE SURE YOU:  Understand these instructions.  Will watch your condition.  Will get help right away if you are not doing well or get worse.   This information is not intended to replace advice given to you by your health care provider. Make sure you discuss any questions you have with your health care provider.   Document Released: 02/01/2000   Document Revised: 02/24/2014 Document Reviewed: 06/21/2013 Elsevier Interactive Patient Education 2016 [employer].    Vaginitis Vaginitis is an inflammation of the vagina. It is most often caused by a change in the normal balance of the bacteria and yeast that live in the vagina. This change in balance causes an overgrowth of certain bacteria or yeast, which causes the  inflammation. There are different types of vaginitis, but the most common types are:  Bacterial vaginosis.  Yeast infection (candidiasis).  Trichomoniasis vaginitis. This is a sexually transmitted infection (STI).  Viral vaginitis.  Atrophic vaginitis.  Allergic vaginitis. CAUSES  The cause depends on the type of vaginitis. Vaginitis can be caused by:  Bacteria (bacterial vaginosis).  Yeast (yeast infection).  A parasite (trichomoniasis vaginitis)  A virus (viral vaginitis).  Low hormone levels (atrophic vaginitis). Low hormone levels can occur during pregnancy, breastfeeding, or after menopause.  Irritants, such as bubble baths, scented tampons, and feminine sprays (allergic vaginitis). Other factors can change the normal balance of the yeast and bacteria that live in the vagina. These include:  Antibiotic medicines.  Poor hygiene.  Diaphragms, vaginal sponges, spermicides, birth control pills, and intrauterine devices (IUD).  Sexual intercourse.  Infection.  Uncontrolled diabetes.  A weakened immune system. SYMPTOMS  Symptoms can vary depending on the cause of the vaginitis. Common symptoms include:  Abnormal vaginal discharge.  The discharge is white, gray, or yellow with bacterial vaginosis.  The discharge is thick, white, and cheesy with a yeast infection.  The discharge is frothy and yellow or greenish with trichomoniasis.  A bad vaginal odor.  The odor is fishy with bacterial vaginosis.  Vaginal itching, pain, or swelling.  Painful intercourse.  Pain or burning when urinating. Sometimes, there are no symptoms. TREATMENT  Treatment will vary depending on the type of infection.   Bacterial vaginosis and trichomoniasis are often treated with antibiotic creams or pills.  Yeast infections are often treated with antifungal medicines, such as vaginal creams or suppositories.  Viral vaginitis has no cure, but symptoms can be treated with medicines  that relieve discomfort. Your sexual partner should be treated as well.  Atrophic vaginitis may be treated with an estrogen cream, pill, suppository, or vaginal ring. If vaginal dryness occurs, lubricants and moisturizing creams may help. You may be told to avoid scented soaps, sprays, or douches.  Allergic vaginitis treatment involves quitting the use of the product that is causing the problem. Vaginal creams can be used to treat the symptoms. HOME CARE INSTRUCTIONS   Take all medicines as directed by your caregiver.  Keep your genital area clean and dry. Avoid soap and only rinse the area with water.  Avoid douching. It can remove the healthy bacteria in the vagina.  Do not use tampons or have sexual intercourse until your vaginitis has been treated. Use sanitary pads while you have vaginitis.  Wipe from front to back. This avoids the spread of bacteria from the rectum to the vagina.  Let air reach your genital area.  Wear cotton underwear to decrease moisture buildup.  Avoid wearing underwear while you sleep until your vaginitis is gone.  Avoid tight pants and underwear or nylons without a cotton panel.  Take off wet clothing (especially bathing suits) as soon as possible.  Use mild, non-scented products. Avoid using irritants, such as:  Scented feminine sprays.  Fabric softeners.  Scented detergents.  Scented tampons.  Scented soaps or bubble baths.  Practice safe sex and use condoms. Condoms may prevent the spread  of trichomoniasis and viral vaginitis. SEEK MEDICAL CARE IF:   You have abdominal pain.  You have a fever or persistent symptoms for more than 2-3 days.  You have a fever and your symptoms suddenly get worse.   This information is not intended to replace advice given to you by your health care provider. Make sure you discuss any questions you have with your health care provider.   Document Released: 12/01/2006 Document Revised: 06/20/2014 Document  Reviewed: 07/17/2011 Elsevier Interactive Patient Education 2016 Elsevier Inc.   Loop Electrosurgical Excision Procedure Loop electrosurgical excision procedure (LEEP) is the removal of a portion of the lower part of the uterus (cervix). The procedure is done when there are significantly abnormal cervical cell changes. Abnormal cell changes of the cervix can lead to cancer if left in place and untreated.  The LEEP procedure itself typically only takes a few minutes. Often, it may be done in your caregiver's office. The procedure is considered safe for those who wish to get pregnant or are trying to get pregnant. Only under rare circumstances should this procedure be done if you are pregnant. LET YOUR CAREGIVER KNOW ABOUT:  Whether you are pregnant or late for your last menstrual period.  Allergies to foods or medicines.  All the medicines you are taking includingherbs, eyedrops, and over-the-counter medicines, and creams.  Use of steroids (by mouth or creams).  Previous problems with anesthetics or numbing medicine.  Previous gynecological surgery.  History of blood clots or bleeding problems.  Any recent or current vaginal infections (herpes, sexually transmitted infections).  Other health problems. RISKS AND COMPLICATIONS  Bleeding.  Infection.  Injury to the vagina, bladder, or rectum.  Very rare obstruction of the cervical opening that causes problems during menstruation (cervical stenosis). BEFORE THE PROCEDURE  Do not take aspirin or blood thinners (anticoagulants) for 1 week before the procedure, or as told by your caregiver.  Eat a light meal before the procedure.  Ask your caregiver about changing or stopping your regular medicines.  You may be given a pain reliever 1 or 2 hours before the procedure. PROCEDURE   A tool (speculum) is placed in the vagina. This allows your caregiver to see the cervix.  An iodine stain is applied to the cervix to find the  area of abnormal cells to be removed.  Medicine is injected to numb the cervix (local anesthetic).   Electricity is passed through a thin wire loop which is then used to remove (cauterize) a small segment of the affected cervix.  Light electrocautery is used to seal any small blood vessels and prevent bleeding.  A paste may be applied to the cauterized area of the cervix to help prevent bleeding.  The tissue sample is sent to the lab. It is examined under the microscope. AFTER THE PROCEDURE  Have someone drive you home.  You may have slight to moderate cramping.  You may notice a black vaginal discharge from the paste used on the cervix to prevent bleeding. This is normal.  Watch for excessive bleeding. This requires immediate medical care.  Ask when your test results will be ready. Make sure you get your test results.   This information is not intended to replace advice given to you by your health care provider. Make sure you discuss any questions you have with your health care provider.   Document Released: 04/26/2002 Document Revised: 04/28/2011 Document Reviewed: 07/16/2010 Elsevier Interactive Patient Education [employer] Elsevier Inc.

## 2015-07-09 NOTE — Progress Notes (Signed)
CLINIC ENCOUNTER NOTE  History:  25 y.o. [id number] here today for evaluation of recurrent abnormal vaginal discharge. Also reports feeling like she has "cuts down there" and she attributes this to the discharge.  Cuts are painful during urination.  She denies any abnormal vaginal  bleeding, pelvic pain or other concerns.   Past Medical History  Diagnosis Date  . GERD (gastroesophageal reflux disease)     with pregnancy  . Headache     Past Surgical History  Procedure Laterality Date  . Cesarean section    . Cesarean section N/A 05/02/2013    Procedure: CESAREAN SECTION;  Surgeon: Catalina AntiguaPeggy Constant, MD;  Location: [facility];  Service: Obstetrics;  Laterality: N/A;    The following portions of the patient's history were reviewed and updated as appropriate: allergies, current medications, past family history, past medical history, past social history, past surgical history and problem list.   Health Maintenance:   02/06/2015 ASCUS-H with + HPV. 03/30/2015 Colposcopy showed CIN II. 07/23/2015 LEEP scheduled.   Review of Systems:  Pertinent items noted in HPI and remainder of comprehensive ROS otherwise negative.  Objective:  Physical Exam BP 126/90 mmHg  Pulse 67  Wt 104 lb 12.8 oz (47.537 kg)  LMP 06/15/2015 (Approximate) CONSTITUTIONAL: Well-developed, well-nourished female in no acute distress.  HENT:  Normocephalic, atraumatic. External right and left ear normal. Oropharynx is clear and moist EYES: Conjunctivae and EOM are normal. Pupils are equal, round, and reactive to light. No scleral icterus.  NECK: Normal range of motion, supple, no masses SKIN: Skin is warm and dry. No rash noted. Not diaphoretic. No erythema. No pallor. NEUROLOGIC: Alert and oriented to person, place, and time. Normal reflexes, muscle tone coordination. No cranial nerve deficit noted. PSYCHIATRIC: Normal mood and affect. Normal behavior. Normal judgment and thought content. CARDIOVASCULAR: Normal heart rate  noted RESPIRATORY: Effort and breath sounds normal, no problems with respiration noted ABDOMEN: Soft, no distention noted.   MUSCULOSKELETAL: Normal range of motion. No edema noted. PELVIC: Normal appearing external genitalia except for tiny ulcers noted in posterior fourchette. Very tender to touch, and mild erythema noted.  Normal appearing vaginal mucosa and cervix. IUD strings visualized.  White discharge noted; samples obtained.  Normal uterine size, no other palpable masses, no uterine or adnexal tenderness. Physical Exam  Genitourinary:        Assessment & Plan:   1. Vulvar ulceration Does not seem like vulvitis noted with candidiasis (more concentrated); more concerning for possible HSV infection. Patient did report boyfriend had reported "cuts on his penis" a few months ago, and she had a negative STI screen then (did not include HSV).  Patient was informed of this likely diagnosis; need to abstain from intercourse during prodromal symptoms/recurrences, need for condoms (may not provide 100% protection but better than nothing). Will check labs for verification, also screen for other STIs. Valtrex prescribed. - HSV(herpes smplx)abs-1+2(IgG+IgM)-bld - Herpes simplex virus culture - valACYclovir (VALTREX) 1000 MG tablet; Take 1 tablet (1,000 mg total) by mouth 2 (two) times daily. Take for ten days.  Dispense: 20 tablet; Refill: 3 - Cervicovaginal ancillary only  2. Screen for STD (sexually transmitted disease) - HSV(herpes smplx)abs-1+2(IgG+IgM)-bld - Herpes simplex virus culture - HIV antibody - RPR - Hepatitis C antibody - Hepatitis B surface antigen - Cervicovaginal ancillary only  3. Vaginal discharge - GC/Chlamydia probe amp ([place] - Cervicovaginal ancillary only Will follow up results and manage accordingly. Proper vulvar hygiene emphasized: discussed avoidance of perfumed soaps,  detergents, lotions and any type of douches; in addition to wearing  cotton underwear and no underwear at night.  Also recommended cleaning front to back, voiding and cleaning up after intercourse.   4. CIN II (cervical intraepithelial neoplasia II) Patient scheduled for LEEP soon, reminded of importance of this appointment. Handont about procedure given to patient.  Routine preventative health maintenance measures emphasized. Please refer to After Visit Summary for other counseling recommendations.   Return in about 2 weeks (around 07/23/2015) for LEEP.   Total face-to-face time with patient: 25 minutes. Over 50% of encounter was spent on counseling and coordination of care.   Jaynie Collins, MD, FACOG Attending Obstetrician & Gynecologist, [facility]

## 2015-07-10 LAB — HEPATITIS C ANTIBODY: HCV Ab: NEGATIVE

## 2015-07-10 LAB — RPR

## 2015-07-10 LAB — GC/CHLAMYDIA PROBE AMP (~~LOC~~) NOT AT ARMC
CHLAMYDIA, DNA PROBE: NEGATIVE
NEISSERIA GONORRHEA: NEGATIVE

## 2015-07-10 LAB — HEPATITIS B SURFACE ANTIGEN: HEP B S AG: NEGATIVE

## 2015-07-10 LAB — HIV ANTIBODY (ROUTINE TESTING W REFLEX): HIV: NONREACTIVE

## 2015-07-10 LAB — CERVICOVAGINAL ANCILLARY ONLY: WET PREP (BD AFFIRM): NEGATIVE

## 2015-07-11 LAB — HERPES SIMPLEX VIRUS CULTURE: Organism ID, Bacteria: NOT DETECTED

## 2015-07-12 LAB — HSV(HERPES SMPLX)ABS-I+II(IGG+IGM)-BLD
HSV 1 GLYCOPROTEIN G AB, IGG: 29.7 {index} — AB (ref <0.90)
Herpes Simplex Vrs I&II-IgM Ab (EIA): 1.04 INDEX

## 2015-07-18 ENCOUNTER — Telehealth: Payer: Self-pay | Admitting: General Practice

## 2015-07-18 NOTE — Telephone Encounter (Signed)
Called patient & informed her of herpes culture and blood test results. Patient verbalized understanding. Patient states symptoms have disappeared and has not taken the medication and wants to know what she needs to do for follow up. Told patient she doesn't need to take the medication if her symptoms have disappeared. Discussed with patient her returning to clinic for additional culture if symptoms reappear. Patient verbalized understanding & had no questions

## 2015-07-23 ENCOUNTER — Other Ambulatory Visit (HOSPITAL_COMMUNITY)
Admission: RE | Admit: 2015-07-23 | Discharge: 2015-07-23 | Disposition: A | Discharge status: Home / Self Care | Payer: BLUE CROSS/BLUE SHIELD | Source: Ambulatory Visit | Attending: Obstetrics & Gynecology | Admitting: Obstetrics & Gynecology

## 2015-07-23 ENCOUNTER — Encounter: Payer: Self-pay | Admitting: Obstetrics & Gynecology

## 2015-07-23 ENCOUNTER — Encounter: Payer: Self-pay | Admitting: General Practice

## 2015-07-23 ENCOUNTER — Ambulatory Visit (INDEPENDENT_AMBULATORY_CARE_PROVIDER_SITE_OTHER): Payer: BLUE CROSS/BLUE SHIELD | Admitting: Obstetrics & Gynecology

## 2015-07-23 VITALS — BP 110/69 | HR 68 | Wt 109.6 lb

## 2015-07-23 DIAGNOSIS — N871 Moderate cervical dysplasia: Secondary | ICD-10-CM | POA: Diagnosis present

## 2015-07-23 NOTE — Patient Instructions (Signed)
Loop Electrosurgical Excision Procedure, Care After Refer to this sheet in the next few weeks. These instructions provide you with information on caring for yourself after your procedure. Your caregiver may also give you more specific instructions. Your treatment has been planned according to current medical practices, but problems sometimes occur. Call your caregiver if you have any problems or questions after your procedure. HOME CARE INSTRUCTIONS   Do not use tampons, douche, or have sexual intercourse for 2 weeks or as directed by your caregiver.  Begin normal activities if you have no or minimal cramping or bleeding, unless directed otherwise by your caregiver.  Take your temperature if you feel sick. Write down your temperature on paper, and tell your caregiver if you have a fever.  Take all medicines as directed by your caregiver.  Keep all your follow-up appointments and Pap tests as directed by your caregiver. SEEK IMMEDIATE MEDICAL CARE IF:   You have bleeding that is heavier or longer than a normal menstrual cycle.  You have bleeding that is bright red.  You have blood clots.  You have a fever.  You have increasing cramps or pain not relieved by medicine.  You develop abdominal pain that does not seem to be related to the same area of earlier cramping and pain.  You are lightheaded, unusually weak, or faint.  You develop painful or bloody urination.  You develop a bad smelling vaginal discharge. MAKE SURE YOU:  Understand these instructions.  Will watch your condition.  Will get help right away if you are not doing well or get worse.   This information is not intended to replace advice given to you by your health care provider. Make sure you discuss any questions you have with your health care provider.   Document Released: 10/17/2010 Document Revised: 02/24/2014 Document Reviewed: 10/17/2010 Elsevier Interactive Patient Education 2016 Elsevier Inc.  

## 2015-07-23 NOTE — Progress Notes (Signed)
Patient ID: Jane Soto, female   DOB: 12/02/1990, 25 y.o.   MRN: [id number] Pap smear and colposcopy reviewed.   Pap 02/06/2015 Diagnosis ATYPICAL SQUAMOUS CELLS CANNOT EXCLUDE A HIGH GRADE SQUAMOUS INTRAEPITHELIAL LESION (ASC-H). +hrHPV Colpo Biopsy/ECC  03/30/2015 Diagnosis Cervix, biopsy, 6:00 and 12:00 -CERVICAL TRANSFORMATION ZONE MUCOSA WITH CIN-II (MODERATE SQUAMOUS DYSPLASIA; HIGH GRADE SQUAMOUS INTRAEPITHELIAL LESION). -NO INVASIVE NEOPLASM IDENTIFIED.  Risks, benefits, alternatives, and limitations of procedure explained to patient, including pain, bleeding, infection, failure to remove abnormal tissue and failure to cure dysplasia, need for repeat procedures, damage to pelvic organs, cervical incompetence.  Role of HPV,cervical dysplasia and need for close followup was empasized. Informed written consent was obtained. All questions were answered. Time out performed.  ??Procedure: The patient was placed in lithotomy position and the bivalved coated speculum was placed in the patient's vagina. A grounding pad placed on the patient. Local anesthesia was administered via an intracervical block using 10cc of 2% Lidocaine with epinephrine. The suction was turned on and the Large 1X Fisher Cone Biopsy Excisor on 280 Watts of cutting current was used to excise the area of decreased uptake and excise the entire transformation zone. Excellent hemostasis was achieved using roller ball coagulation set at 80 Watts coagulation current. Monsel's solution was then applied and excellent hemostasis was noted.  The speculum was removed from the vagina. Specimens were sent to pathology. ?The patient tolerated the procedure well. Post-operative instructions given to patient, including instruction to seek medical attention for persistent bright red bleeding, fever, abdominal/pelvic pain, dysuria, nausea or vomiting. She was also told about the possibility of having copious yellow to black tinged discharge.  She was counseled to avoid anything in the vagina (sex/douching/tampons) for 4 weeks. She has a  2 week post-operative check to review results and assess wound healing. Follow up in 4 months for repeat pap or as needed.  Meryl Ponder L. Harraway-Smith, M.D., Evern CoreFACOG

## 2015-07-24 ENCOUNTER — Telehealth: Payer: Self-pay | Admitting: Neurology

## 2015-07-25 ENCOUNTER — Ambulatory Visit: Payer: Self-pay

## 2015-07-25 NOTE — Telephone Encounter (Signed)
    sent records to [employer] attorney at law

## 2015-07-31 ENCOUNTER — Ambulatory Visit (INDEPENDENT_AMBULATORY_CARE_PROVIDER_SITE_OTHER): Payer: BLUE CROSS/BLUE SHIELD | Admitting: Obstetrics and Gynecology

## 2015-07-31 ENCOUNTER — Telehealth: Payer: Self-pay

## 2015-07-31 ENCOUNTER — Ambulatory Visit: Payer: BLUE CROSS/BLUE SHIELD

## 2015-07-31 VITALS — BP 115/79 | HR 78

## 2015-07-31 DIAGNOSIS — N898 Other specified noninflammatory disorders of vagina: Secondary | ICD-10-CM | POA: Diagnosis not present

## 2015-07-31 NOTE — Telephone Encounter (Signed)
Pt called clinic in regards to post LEEP procedure concerns. Pt states that she has had a foul discharge the last few day and had bright red bleeding yesterday that has now subsided, patient has clear watery discharge that has a foul smell that she wants to be evaluated for. Told patient she could come in if it was before 1600. Pt states understanding and has no further questions.

## 2015-07-31 NOTE — Progress Notes (Signed)
CC: vaginal discharge   SUBJECTIVE:  HPI Comments:   Jane Soto is a 25 y.o. [id number] with malodorous vaginal discharge. She underwent LEEP 07/25/15 for:  Pap 02/06/2015 Diagnosis ATYPICAL SQUAMOUS CELLS CANNOT EXCLUDE A HIGH GRADE SQUAMOUS INTRAEPITHELIAL LESION (ASC-H). +hrHPV Colpo Biopsy/ECC 03/30/2015 Diagnosis Cervix, biopsy, 6:00 and 12:00 -CERVICAL TRANSFORMATION ZONE MUCOSA WITH CIN-II (MODERATE SQUAMOUS DYSPLASIA; HIGH GRADE SQUAMOUS INTRAEPITHELIAL LESION). -NO INVASIVE NEOPLASM IDENTIFIED. The procedure was uncomplicated and well tolerated. She has been following pelvic rest. Since 07/27/15 she has had increased bloody to clear malodorous vaginal discharge associated with right-sided suprapubic sharp pain. Yesterday she had bright red bleeding which has subsided.  ROS negative for vaginal irritation or pruritis, fever/chills, nausea/ vomiting, dysuria.  OBJECTIVE: Filed Vitals:   07/31/15 1531  BP: 115/79  Pulse: 78   General: WN/WD in NAD Abd: soft, flat, minimally tender localized to right suprapubic region, no guarding  Speculum exam: Scant blackish thick discharge swabbed from vagina; no active bleeding noted. Cx not well visualized  Pathology report of 07/25/15 reviewed with patient: FINAL DIAGNOSIS Diagnosis Cervix, LEEP -CERVICAL TRANSFORMATION ZONE MUCOSA WITH CIN-II/III (MODERATE TO SEVERE SQUAMOUS DYSPLASIA/SQUAMOUS CARCINOMA IN SITU; HIGH GRADE SQUAMOUS INTRAEPITHELIAL LESION). -NO INVASIVE NEOPLASM IDENTIFIED. -MARGINS OF RESECTION ARE NEGATIVE FOR NEOPLASIA. Marlena ClipperZhaoli Lane MD Pathologist  ASSESSMENT: S/P LEEP with vaginal discharge probably c/w normal healing process  PLAN: Discharged with reassurance WP sent Continue pelvic rest F/U as scheduled for 1 month post LEEP.  Jane Soto, CNM 07/31/2015

## 2015-07-31 NOTE — Patient Instructions (Signed)

## 2015-08-01 ENCOUNTER — Telehealth: Payer: Self-pay | Admitting: General Practice

## 2015-08-01 LAB — WET PREP, GENITAL
Trich, Wet Prep: NONE SEEN
YEAST WET PREP: NONE SEEN

## 2015-08-01 NOTE — Telephone Encounter (Signed)
Per Dr Erin FullingHarraway Smith, patient's LEEP margins are clear of dysplasia. She should f/u in 4 weeks for a post LEEP check and in 6 months for a repeat PAP. Called patient, no answer- left message stating we are trying to reach you with results, please call us back at the clinics

## 2015-08-01 NOTE — Telephone Encounter (Signed)
Spoke to pt and informed her that the LEEP specimen margins were clear of dysplasia (abnormal cells). She should keep follow up appt as scheduled on 7/7. She will also need Pap w/co-testing in 6 months.  Pt voiced understanding of all information given.

## 2015-08-22 ENCOUNTER — Telehealth: Payer: Self-pay | Admitting: General Practice

## 2015-08-22 ENCOUNTER — Other Ambulatory Visit: Payer: Self-pay | Admitting: Obstetrics & Gynecology

## 2015-08-22 DIAGNOSIS — B9689 Other specified bacterial agents as the cause of diseases classified elsewhere: Secondary | ICD-10-CM

## 2015-08-22 DIAGNOSIS — N760 Acute vaginitis: Principal | ICD-10-CM

## 2015-08-22 MED ORDER — METRONIDAZOLE 500 MG PO TABS: 500.0000 mg | ORAL_TABLET | Freq: Two times a day (BID) | ORAL | Status: DC

## 2015-08-22 NOTE — Telephone Encounter (Signed)
Patient's wet prep was positive for BV. If patient is having symptoms, may prescribe flagyl per Deirdre.

## 2015-08-22 NOTE — Telephone Encounter (Signed)
Patient called and left message to call back. Called patient, no answer- left message stating we are trying to reach you to return your phone call, please call us back at the clinics

## 2015-08-22 NOTE — Telephone Encounter (Signed)
Patient called back into front office and I informed her of results & what BV was. Also informed patient of medication sent to pharmacy, directions & to avoid alcohol while on the medication. Patient verbalized understanding to all & had no questions

## 2015-08-24 ENCOUNTER — Ambulatory Visit: Payer: BLUE CROSS/BLUE SHIELD | Admitting: Obstetrics & Gynecology

## 2015-09-06 ENCOUNTER — Ambulatory Visit (INDEPENDENT_AMBULATORY_CARE_PROVIDER_SITE_OTHER): Payer: BLUE CROSS/BLUE SHIELD | Admitting: *Deleted

## 2015-09-06 VITALS — BP 121/60 | HR 61

## 2015-09-06 DIAGNOSIS — Z23 Encounter for immunization: Secondary | ICD-10-CM | POA: Diagnosis not present

## 2015-09-06 NOTE — Patient Instructions (Signed)
Probiotics Probiotics WHAT ARE PROBIOTICS? Probiotics are the good bacteria and yeasts that live in your body and keep you and your digestive system healthy. Probiotics also help your body's defense (immune) system and protect your body against bad bacterial growth.  Certain foods contain probiotics, such as yogurt. Probiotics can also be purchased as a supplement. As with any supplement or drug, it is important to discuss its use with your health care provider.  WHAT AFFECTS THE BALANCE OF BACTERIA IN MY BODY? The balance of bacteria in your body can be affected by:   Antibiotic medicines. Antibiotics are sometimes necessary to treat infection. Unfortunately, they may kill good or friendly bacteria in your body as well as the bad bacteria. This may lead to stomach problems like diarrhea, gas, and cramping.  Disease. Some conditions are the result of an overgrowth of bad bacteria, yeasts, parasites, or fungi. These conditions include:   Infectious diarrhea.  Stomach and respiratory infections.  Skin infections.  Irritable bowel syndrome (IBS).  Inflammatory bowel diseases.  Ulcer due to Helicobacter pylori (H. pylori) infection.  Tooth decay and periodontal disease.  Vaginal infections. Stress and poor diet may also lower the good bacteria in your body.  WHAT TYPE OF PROBIOTIC IS RIGHT FOR ME? Probiotics are available over the counter at your local pharmacy, health food, or grocery store. They come in many different forms, combinations of strains, and dosing strengths. Some may need to be refrigerated. Always read the label for storage and usage instructions. Specific strains have been shown to be more effective for certain conditions. Ask your health care provider what option is best for you.  WHY WOULD I NEED PROBIOTICS? There are many reasons your health care provider might recommend a probiotic supplement, including:   Diarrhea.  Constipation.  IBS.  Respiratory  infections.  Yeast infections.  Acne, eczema, and other skin conditions.  Frequent urinary tract infections (UTIs). ARE THERE SIDE EFFECTS OF PROBIOTICS? Some people experience mild side effects when taking probiotics. Side effects are usually temporary and may include:   Gas.  Bloating.  Cramping. Rarely, serious side effects, such as infection or immune system changes, may occur. WHAT ELSE DO I NEED TO KNOW ABOUT PROBIOTICS?   There are many different strains of probiotics. Certain strains may be more effective depending on your condition. Probiotics are available in varying doses. Ask your health care provider which probiotic you should use and how often.   If you are taking probiotics along with antibiotics, it is generally recommended to wait at least 2 hours between taking the antibiotic and taking the probiotic.  FOR MORE INFORMATION:  [facility] http://potts.com/http://nccam.nih.gov/   This information is not intended to replace advice given to you by your health care provider. Make sure you discuss any questions you have with your health care provider.   Document Released: 08/31/2013 Document Reviewed: 08/31/2013 Elsevier Interactive Patient Education [employer]2016 Elsevier Inc.

## 2015-09-06 NOTE — Progress Notes (Signed)
Here for 3rd gardisil - per chart review is a little early - due 09/27/15. Discussed with Dr. Macon LargeAnyanwu and may go ahead and give 3rd dose.

## 2015-10-12 ENCOUNTER — Other Ambulatory Visit: Payer: Self-pay | Admitting: Obstetrics & Gynecology

## 2015-10-12 ENCOUNTER — Ambulatory Visit (INDEPENDENT_AMBULATORY_CARE_PROVIDER_SITE_OTHER): Payer: BLUE CROSS/BLUE SHIELD | Admitting: Obstetrics & Gynecology

## 2015-10-12 VITALS — BP 112/70 | HR 63 | Wt 109.5 lb

## 2015-10-12 DIAGNOSIS — B9689 Other specified bacterial agents as the cause of diseases classified elsewhere: Secondary | ICD-10-CM

## 2015-10-12 DIAGNOSIS — N760 Acute vaginitis: Secondary | ICD-10-CM | POA: Diagnosis not present

## 2015-10-12 DIAGNOSIS — N898 Other specified noninflammatory disorders of vagina: Secondary | ICD-10-CM

## 2015-10-12 DIAGNOSIS — A499 Bacterial infection, unspecified: Secondary | ICD-10-CM

## 2015-10-12 DIAGNOSIS — N9489 Other specified conditions associated with female genital organs and menstrual cycle: Secondary | ICD-10-CM | POA: Diagnosis not present

## 2015-10-12 MED ORDER — METRONIDAZOLE 500 MG PO TABS: 500.0000 mg | ORAL_TABLET | Freq: Two times a day (BID) | ORAL | 0 refills | Status: DC

## 2015-10-12 NOTE — Patient Instructions (Addendum)
Conization of the Cervix, Care After Refer to this sheet in the next few weeks. These instructions provide you with information on caring for yourself after your procedure. Your health care provider may also give you more specific instructions. Your treatment has been planned according to current medical practices but problems sometimes occur. Call your health care provider if you have any problems or questions after your procedure. WHAT TO EXPECT AFTER THE PROCEDURE After your procedure, it is typical to have the following sensations:  If you had a general anesthetic, you may be groggy for 2-3 hours after the procedure.  You may have cramps (similar to menstrual cramps) for about 1 week.   You may have a bloody discharge or light to moderate bleeding for 1-2 weeks. The bleeding should not be heavy (for example, it should not soak 1 pad in less than 1 hour).  You may have a black vaginal discharge that looks similar to coffee grounds. This is from the paste that was applied to the cervix to control bleeding. This is normal. Recovery may take up to 3 weeks.  HOME CARE INSTRUCTIONS   Arrange for someone to drive you home after the procedure.  Only take medicines as directed by your health care provider. Do not take aspirin. It can cause bleeding.   Take showers for the first week. Do not take baths, swim, or use hot tubs until your health care provider says it is okay.   Do not douche, use tampons, or have sexual intercourse until your health care provider says it is okay.   Avoid strenuous activities, exercises, and heavy lifting for at least 7-14 days.  You may resume your normal diet unless your health care provider advises you differently.    If you are constipated, you may:  Take a mild laxative as directed by your health care provider.   Add fruit and bran to your diet.   Make sure to drink enough fluids to keep your urine clear or pale yellow.  Keep follow-up  appointments with your health care provider. SEEK MEDICAL CARE IF:   You develop a rash.   You are dizzy or lightheaded.   You feel nauseous.   You develop a bad smelling vaginal discharge. SEEK IMMEDIATE MEDICAL CARE IF:   You have blood clots or bleeding that is heavier than a normal menstrual period (for example, soaking a pad in less than 1 hour) or you develop bright red bleeding.   You have a fever over 101F (38.3C) or persistent symptoms for more than 2-3 days.   You have a fever over 101F (38.3C) and your symptoms suddenly get worse.  You have increasing cramps.   You faint.   You have pain when urinating.  You have bloody urine.   You start vomiting.   Your pain is not relieved with your medicine.   Your have severe or worsening pain. MAKE SURE YOU:  Understand these instructions.  Will watch your condition.  Will get help right away if you are not doing well or get worse.   This information is not intended to replace advice given to you by your health care provider. Make sure you discuss any questions you have with your health care provider.   Document Released: 02/03/2005 Document Revised: 02/08/2013 Document Reviewed: 07/30/2012 Elsevier Interactive Patient Education 2016 Elsevier Inc.   GO WHITE: Soap: UNSCENTED Dove (white box light green writing) Laundry detergent (underwear)- Dreft or [employer] unscented WHITE 100% cotton panties (  NOT just cotton crouch) Sanitary napkin/panty liners: UNSCENTED.  If it doesn't SAY unscented it can have a scent/perfume    NO PERFUMES OR LOTIONS OR POTIONS in the vulvar area (may use regular KY) Condoms: hypoallergenic only. Non dyed (no color) Toilet papers: white only Wash clothes: use a separate wash cloth. WHITE.  Washed in Dreft.

## 2015-10-12 NOTE — Addendum Note (Signed)
Addended by: Sherre LainASH, Carmichael Burdette A on: 10/12/2015 11:03 AM   Modules accepted: Orders

## 2015-10-12 NOTE — Progress Notes (Signed)
History:  25 y.o. [id number] here today for post LEEP check. Pt alosc/o continued foul vaginal odor. She is suing [employer] soap with the pink writing.  She also wears elastic underwear.  She denies irreg bleeding since the LEEP  The following portions of the patient's history were reviewed and updated as appropriate: allergies, current medications, past family history, past medical history, past social history, past surgical history and problem list.  Review of Systems:  Pertinent items are noted in HPI.  Objective:  Physical Exam Blood pressure 112/70, pulse 63, weight 109 lb 8 oz (49.7 kg), unknown if currently breastfeeding. Gen: NAD Abd: Soft, nontender and nondistended Pelvic: Normal appearing external genitalia; normal appearing vaginal mucosa and cervix.  Normal discharge. No CMT.  Labs and Imaging 07/23/2015 Diagnosis Cervix, LEEP -CERVICAL TRANSFORMATION ZONE MUCOSA WITH CIN-II/III (MODERATE TO SEVERE SQUAMOUS DYSPLASIA/SQUAMOUS CARCINOMA IN SITU; HIGH GRADE SQUAMOUS INTRAEPITHELIAL LESION). -NO INVASIVE NEOPLASM IDENTIFIED. -MARGINS OF RESECTION ARE NEGATIVE FOR NEOPLASIA.  Assessment & Plan:  Recurrent BV Post LEEP check  F/u in 6 mnths for repeat PAP Rec GO WHITE reviewed with pt Flagyl 500mg  bid x 7 days Cont probiotics F/u wet mount Reviewed surg path  Kerby Borner L. Harraway-Smith, M.D., [facility]   Alizae Bechtel L. Harraway-Smith, M.D., [facility]

## 2015-10-19 LAB — WET PREP, GENITAL
Trich, Wet Prep: NONE SEEN
Yeast Wet Prep HPF POC: NONE SEEN

## 2015-12-15 ENCOUNTER — Emergency Department (HOSPITAL_COMMUNITY)
Admission: EM | Admit: 2015-12-15 | Discharge: 2015-12-15 | Disposition: A | Discharge status: Home / Self Care | Payer: BLUE CROSS/BLUE SHIELD | Attending: Emergency Medicine | Admitting: Emergency Medicine

## 2015-12-15 ENCOUNTER — Encounter (HOSPITAL_COMMUNITY): Payer: Self-pay | Admitting: Emergency Medicine

## 2015-12-15 DIAGNOSIS — Y929 Unspecified place or not applicable: Secondary | ICD-10-CM | POA: Diagnosis not present

## 2015-12-15 DIAGNOSIS — Y9389 Activity, other specified: Secondary | ICD-10-CM | POA: Diagnosis not present

## 2015-12-15 DIAGNOSIS — X58XXXA Exposure to other specified factors, initial encounter: Secondary | ICD-10-CM | POA: Insufficient documentation

## 2015-12-15 DIAGNOSIS — T192XXA Foreign body in vulva and vagina, initial encounter: Secondary | ICD-10-CM | POA: Diagnosis present

## 2015-12-15 DIAGNOSIS — Y999 Unspecified external cause status: Secondary | ICD-10-CM | POA: Diagnosis not present

## 2015-12-15 NOTE — Discharge Instructions (Signed)
If you are still having pain in the next 2-3 days, please see your OBGYN.
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
## 2015-12-15 NOTE — ED Triage Notes (Signed)
Pt states she is on her period and believes her tampon is stuck. She tried pulling it out and the string is not longer attached. Pt reports it has been in place over 14 hours.

## 2015-12-24 ENCOUNTER — Telehealth: Payer: Self-pay | Admitting: General Practice

## 2015-12-24 NOTE — Telephone Encounter (Signed)
Patient called and left message requesting to speak with someone about her CT scan from last year. Per Dr Shawnie PonsPratt, scan is nonspecific and nonconfirmatory- if patient has extra renal collecting system, increases risk of urinary tract infection. Called patient back & discussed results with her. Patient states she is having some pain and bladder pressure again and thinks she may have a UTI. Offered patient to come in for an appt to drop off a urine sample for UA. Patient verbalized understanding & states she will. Patient had no questions

## 2016-02-15 ENCOUNTER — Ambulatory Visit (HOSPITAL_COMMUNITY): Payer: BLUE CROSS/BLUE SHIELD

## 2016-02-18 HISTORY — PX: PLACEMENT OF BREAST IMPLANTS: SHX6334

## 2016-04-07 ENCOUNTER — Telehealth (HOSPITAL_COMMUNITY): Payer: Self-pay | Admitting: *Deleted

## 2016-04-07 NOTE — Telephone Encounter (Signed)
Patient called to schedule pap smear but patient not eligible for [facility] due to having [employer]. Patient voiced understanding.

## 2016-04-09 ENCOUNTER — Ambulatory Visit (INDEPENDENT_AMBULATORY_CARE_PROVIDER_SITE_OTHER): Payer: BLUE CROSS/BLUE SHIELD | Admitting: *Deleted

## 2016-04-09 DIAGNOSIS — R300 Dysuria: Secondary | ICD-10-CM

## 2016-04-09 DIAGNOSIS — Z113 Encounter for screening for infections with a predominantly sexual mode of transmission: Secondary | ICD-10-CM

## 2016-04-09 DIAGNOSIS — N898 Other specified noninflammatory disorders of vagina: Secondary | ICD-10-CM

## 2016-04-09 LAB — POCT URINALYSIS DIP (DEVICE)
Glucose, UA: NEGATIVE mg/dL
Hgb urine dipstick: NEGATIVE
Ketones, ur: NEGATIVE mg/dL
Nitrite: NEGATIVE
Protein, ur: NEGATIVE mg/dL
Specific Gravity, Urine: 1.025 (ref 1.005–1.030)
UROBILINOGEN UA: 1 mg/dL (ref 0.0–1.0)
pH: 6.5 (ref 5.0–8.0)

## 2016-04-09 NOTE — Progress Notes (Signed)
Pt reports vaginal discharge and irritation. She also has painful urination. Pt performed self swab to assess for BV, Yeast and trich.  Urine specimen obtained and sent for culture. She will be called with results

## 2016-04-10 LAB — CERVICOVAGINAL ANCILLARY ONLY
Bacterial vaginitis: NEGATIVE
CANDIDA VAGINITIS: NEGATIVE
TRICH (WINDOWPATH): NEGATIVE

## 2016-04-11 ENCOUNTER — Telehealth: Payer: Self-pay | Admitting: *Deleted

## 2016-04-11 LAB — URINE CULTURE: ORGANISM ID, BACTERIA: NO GROWTH

## 2016-04-11 NOTE — Telephone Encounter (Signed)
Called pt and left message stating that all of her recent test results were negative. She does not have any problem which requires treatment. If she has questions, she can call back next week or schedule an appt if she is still having problems.

## 2016-04-14 ENCOUNTER — Telehealth: Payer: Self-pay | Admitting: General Practice

## 2016-04-14 NOTE — Telephone Encounter (Signed)
Patient called and left message requesting test results. Called patient and informed her of negative results. Patient verbalized understanding and asked what could be going on but she is having a discharge with odor that has just got worse. Told patient that she could have a bacterial infection that didn't show up last time because maybe it was too early. Patient states she doesn't really want to come back in and asked when her next appt was. Informed her of appt 3/6. Patient verbalized understanding & states she will wait until that appt to be retested. Patient had no questions

## 2016-04-22 ENCOUNTER — Ambulatory Visit (INDEPENDENT_AMBULATORY_CARE_PROVIDER_SITE_OTHER): Payer: BLUE CROSS/BLUE SHIELD | Admitting: Obstetrics & Gynecology

## 2016-04-22 VITALS — BP 119/75 | HR 54 | Wt 121.4 lb

## 2016-04-22 DIAGNOSIS — Z113 Encounter for screening for infections with a predominantly sexual mode of transmission: Secondary | ICD-10-CM

## 2016-04-22 DIAGNOSIS — N898 Other specified noninflammatory disorders of vagina: Secondary | ICD-10-CM | POA: Diagnosis not present

## 2016-04-22 DIAGNOSIS — N871 Moderate cervical dysplasia: Secondary | ICD-10-CM

## 2016-04-22 DIAGNOSIS — Z124 Encounter for screening for malignant neoplasm of cervix: Secondary | ICD-10-CM

## 2016-04-22 DIAGNOSIS — Z1151 Encounter for screening for human papillomavirus (HPV): Secondary | ICD-10-CM

## 2016-04-22 NOTE — Progress Notes (Signed)
History:  26 y.o. [id number] here today for repeat PAP. Pt s/p LEEP 07/2015 with note of negative margins.  Pt also reports that she has an odor. She denies significant discharge.She denies new soaps or feminine products.  She denies any new sexual partners.  She denies pain or other assoc sx.  The following portions of the patient's history were reviewed and updated as appropriate: allergies, current medications, past family history, past medical history, past social history, past surgical history and problem list.  Review of Systems:  Pertinent items are noted in HPI.   Objective:  Physical Exam Blood pressure 119/75, pulse (!) 54, weight 121 lb 6.4 oz (55.1 kg), unknown if currently breastfeeding. Gen: NAD Abd: Soft, nontender and nondistended Pelvic: Normal appearing external genitalia; normal appearing vaginal mucosa and cervix. Her cerivx was tender to manipulation with the cotton-tipped applicator but, no CMT on palpation.   Normal discharge.  Small uterus, no other palpable masses, no uterine or adnexal tenderness   Assessment & Plan:  H/o cervical dysplasia Vaginal odor without discharge  PAP obtained. F/u results F/u cx and wet mount  Kiernan Farkas L. Harraway-Smith, M.D., [facility]

## 2016-04-23 ENCOUNTER — Encounter: Payer: Self-pay | Admitting: Obstetrics & Gynecology

## 2016-04-23 ENCOUNTER — Other Ambulatory Visit: Payer: Self-pay | Admitting: Obstetrics & Gynecology

## 2016-04-23 DIAGNOSIS — B9689 Other specified bacterial agents as the cause of diseases classified elsewhere: Secondary | ICD-10-CM

## 2016-04-23 DIAGNOSIS — N760 Acute vaginitis: Principal | ICD-10-CM

## 2016-04-23 LAB — CERVICOVAGINAL ANCILLARY ONLY
Bacterial vaginitis: POSITIVE — AB
CHLAMYDIA, DNA PROBE: NEGATIVE
Candida vaginitis: NEGATIVE
NEISSERIA GONORRHEA: NEGATIVE
Trichomonas: NEGATIVE

## 2016-04-23 MED ORDER — METRONIDAZOLE 500 MG PO TABS
500.0000 mg | ORAL_TABLET | Freq: Two times a day (BID) | ORAL | 0 refills | Status: DC
Start: 2016-04-23 — End: 2018-02-03

## 2016-04-25 LAB — CYTOLOGY - PAP
Diagnosis: NEGATIVE
HPV (WINDOPATH): NOT DETECTED

## 2016-08-08 ENCOUNTER — Other Ambulatory Visit: Payer: Self-pay | Admitting: Obstetrics & Gynecology

## 2016-08-08 DIAGNOSIS — N760 Acute vaginitis: Principal | ICD-10-CM

## 2016-08-08 DIAGNOSIS — B9689 Other specified bacterial agents as the cause of diseases classified elsewhere: Secondary | ICD-10-CM

## 2016-08-11 ENCOUNTER — Telehealth: Payer: Self-pay

## 2016-08-11 DIAGNOSIS — N898 Other specified noninflammatory disorders of vagina: Secondary | ICD-10-CM

## 2016-08-11 DIAGNOSIS — B9689 Other specified bacterial agents as the cause of diseases classified elsewhere: Secondary | ICD-10-CM

## 2016-08-11 DIAGNOSIS — N760 Acute vaginitis: Secondary | ICD-10-CM

## 2016-08-11 MED ORDER — FLUCONAZOLE 150 MG PO TABS
150.0000 mg | ORAL_TABLET | Freq: Once | ORAL | 0 refills | Status: AC
Start: 2016-08-11 — End: 2016-08-11

## 2016-08-11 MED ORDER — METRONIDAZOLE 500 MG PO TABS
500.0000 mg | ORAL_TABLET | Freq: Two times a day (BID) | ORAL | 0 refills | Status: DC
Start: 2016-08-11 — End: 2018-02-03

## 2016-08-11 NOTE — Telephone Encounter (Signed)
Pt called requesting an antibiotic for tx for a yeast infection.   Pt stated that she is having a fishy odor with discharge.  I informed pt that it seems that she may have BV.  Pt stated that she has had that in the past.  I explained to the pt that I can send an Rx for Flagyl 500 mg po bid for 7 days and also a Diflucan to take after she completes the Flagyl course due it may cause a yeast infection.  Pt's pharmacy verified.   I advised pt that if after tx her symptoms do not go away or come back then we request that she calls and makes an appt to be evaluated.   Pt stated understanding with no further questions.
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
## 2017-12-08 ENCOUNTER — Other Ambulatory Visit: Payer: Self-pay

## 2017-12-08 ENCOUNTER — Telehealth: Payer: Self-pay | Admitting: General Practice

## 2017-12-08 DIAGNOSIS — B9689 Other specified bacterial agents as the cause of diseases classified elsewhere: Secondary | ICD-10-CM

## 2017-12-08 DIAGNOSIS — N898 Other specified noninflammatory disorders of vagina: Secondary | ICD-10-CM

## 2017-12-08 DIAGNOSIS — N760 Acute vaginitis: Secondary | ICD-10-CM

## 2017-12-08 NOTE — Telephone Encounter (Signed)
Patient called and left message on nurse voicemail line stating she needs a refill on her vaginal infection medication. Per chart review, patient has not been seen since 04/2016. Called patient, no answer- left message stating we are trying to reach you to return your phone call. Please call us back if you still need assistance. You may also come in to the office during our walk in hours from [phone] tonight & tomorrow night for assistance.

## 2017-12-14 ENCOUNTER — Encounter: Payer: Self-pay | Admitting: *Deleted

## 2017-12-14 NOTE — Telephone Encounter (Signed)
Sent to the wrong office. This patient is a [facility] patient

## 2017-12-15 DIAGNOSIS — R52 Pain, unspecified: Secondary | ICD-10-CM | POA: Diagnosis not present

## 2017-12-15 DIAGNOSIS — R05 Cough: Secondary | ICD-10-CM | POA: Diagnosis not present

## 2017-12-15 DIAGNOSIS — J101 Influenza due to other identified influenza virus with other respiratory manifestations: Secondary | ICD-10-CM | POA: Diagnosis not present

## 2018-02-03 ENCOUNTER — Ambulatory Visit (INDEPENDENT_AMBULATORY_CARE_PROVIDER_SITE_OTHER): Payer: BLUE CROSS/BLUE SHIELD | Admitting: Nurse Practitioner

## 2018-02-03 ENCOUNTER — Encounter: Payer: Self-pay | Admitting: Nurse Practitioner

## 2018-02-03 VITALS — BP 131/82 | HR 69 | Wt 120.3 lb

## 2018-02-03 DIAGNOSIS — Z01419 Encounter for gynecological examination (general) (routine) without abnormal findings: Secondary | ICD-10-CM | POA: Diagnosis not present

## 2018-02-03 DIAGNOSIS — B9689 Other specified bacterial agents as the cause of diseases classified elsewhere: Secondary | ICD-10-CM | POA: Diagnosis not present

## 2018-02-03 DIAGNOSIS — N898 Other specified noninflammatory disorders of vagina: Secondary | ICD-10-CM

## 2018-02-03 DIAGNOSIS — Z1151 Encounter for screening for human papillomavirus (HPV): Secondary | ICD-10-CM | POA: Diagnosis not present

## 2018-02-03 DIAGNOSIS — Z113 Encounter for screening for infections with a predominantly sexual mode of transmission: Secondary | ICD-10-CM

## 2018-02-03 DIAGNOSIS — B373 Candidiasis of vulva and vagina: Secondary | ICD-10-CM | POA: Diagnosis not present

## 2018-02-03 DIAGNOSIS — Z124 Encounter for screening for malignant neoplasm of cervix: Secondary | ICD-10-CM | POA: Diagnosis not present

## 2018-02-03 DIAGNOSIS — Z9882 Breast implant status: Secondary | ICD-10-CM | POA: Insufficient documentation

## 2018-02-03 DIAGNOSIS — N760 Acute vaginitis: Secondary | ICD-10-CM

## 2018-02-03 NOTE — Progress Notes (Signed)
GYNECOLOGY ANNUAL PREVENTATIVE CARE ENCOUNTER NOTE  Subjective:   Jane Soto is a 28 y.o. G60P2012 female here for a routine annual gynecologic exam.  Current complaints: Wants pap and is a bit worried about vaginal discharge - wants to be checked.   Denies abnormal vaginal bleeding, discharge, pelvic pain, problems with intercourse or other gynecologic concerns.    Gynecologic History Patient's last menstrual period was 01/12/2018 (approximate). Contraception: IUD - Mirena for 4 years Last Pap: 04-25-16  Results were: normal   Obstetric History OB History  Gravida Para Term Preterm AB Living  3 2 2  0 1 2  SAB TAB Ectopic Multiple Live Births  1 0 0 0 2    # Outcome Date GA Lbr Len/2nd Weight Sex Delivery Anes PTL Lv  3 Term 05/02/13 [redacted]w[redacted]d  6 lb 11.4 oz (3.045 kg) M CS-LVertical Spinal  LIV  2 SAB           1 Term      CS-LTranv   LIV    Past Medical History:  Diagnosis Date  . GERD (gastroesophageal reflux disease)    with pregnancy  . Headache     Past Surgical History:  Procedure Laterality Date  . CESAREAN SECTION    . CESAREAN SECTION N/A 05/02/2013   Procedure: CESAREAN SECTION;  Surgeon: Catalina Antigua, MD;  Location: [facility];  Service: Obstetrics;  Laterality: N/A;    No current outpatient medications on file prior to visit.   No current facility-administered medications on file prior to visit.     No Known Allergies  Social History   Socioeconomic History  . Marital status: Single    Spouse name: Not on file  . Number of children: 2  . Years of education: 51  . Highest education level: Not on file  Occupational History  . Occupation: Transport planner  Social Needs  . Financial resource strain: Not on file  . Food insecurity:    Worry: Not on file    Inability: Not on file  . Transportation needs:    Medical: Not on file    Non-medical: Not on file  Tobacco Use  . Smoking status: Never Smoker  . Smokeless tobacco: Never Used  Substance  and Sexual Activity  . Alcohol use: No    Alcohol/week: 0.0 standard drinks    Comment: Socially  . Drug use: No  . Sexual activity: Yes    Birth control/protection: I.U.D.  Lifestyle  . Physical activity:    Days per week: Not on file    Minutes per session: Not on file  . Stress: Not on file  Relationships  . Social connections:    Talks on phone: Not on file    Gets together: Not on file    Attends religious service: Not on file    Active member of club or organization: Not on file    Attends meetings of clubs or organizations: Not on file    Relationship status: Not on file  . Intimate partner violence:    Fear of current or ex partner: Not on file    Emotionally abused: Not on file    Physically abused: Not on file    Forced sexual activity: Not on file  Other Topics Concern  . Not on file  Social History Narrative   Lives at home alone.   Right-handed.   No caffeine use.    Family History  Problem Relation Age of Onset  . Heart disease Father   .  Diabetes Mother     The following portions of the patient's history were reviewed and updated as appropriate: allergies, current medications, past family history, past medical history, past social history, past surgical history and problem list.  Review of Systems Pertinent items noted in HPI and remainder of comprehensive ROS otherwise negative.   Objective:  BP 131/82   Pulse 69   Wt 120 lb 4.8 oz (54.6 kg)   LMP 01/12/2018 (Approximate)   BMI 20.65 kg/m  CONSTITUTIONAL: Well-developed, well-nourished female in no acute distress.  HENT:  Normocephalic, atraumatic, External right and left ear normal.  EYES: Conjunctivae and EOM are normal. Pupils are equal, round.  No scleral icterus.  NECK: Normal range of motion, supple, no masses.  Normal thyroid.  SKIN: Skin is warm and dry. No rash noted. Not diaphoretic. No erythema. No pallor. NEUROLOGIC: Alert and oriented to person, place, and time. Normal reflexes,  muscle tone coordination. No cranial nerve deficit noted. PSYCHIATRIC: Normal mood and affect. Normal behavior. Normal judgment and thought content. CARDIOVASCULAR: Normal heart rate noted, regular rhythm RESPIRATORY: Clear to auscultation bilaterally. Effort and breath sounds normal, no problems with respiration noted. BREASTS: Symmetric in size. No masses, skin changes, nipple drainage, or lymphadenopathy. Bilateral implants which limited full evaluation of breast tissue. ABDOMEN: Soft, no distention noted.  No tenderness, rebound or guarding.  PELVIC: Normal appearing external genitalia; slightly red appearing vaginal mucosa and cervix.  No abnormal discharge noted.  Pap smear obtained. NO IUD STRINGS SEEN - client thinks they may have been cut during the LEEP.  Had discomfort during speculum exam - bimanual deferred MUSCULOSKELETAL: Normal range of motion. No tenderness.  No cyanosis, clubbing, or edema.    Assessment and Plan:  1. Pap smear for cervical cancer screening Previous leep and has only been followed up once for pap since LEEP done 07-23-15 for CIN III  - Cervicovaginal ancillary only( [place])  2. Encounter for well woman exam with routine gynecological exam Discussed keeping her IUD for 7 years if desired.  - HIV antibody (with reflex) - Cytology - PAP( [place])  Will follow up results of pap smear and manage accordingly. Routine preventative health maintenance measures emphasized. Please refer to After Visit Summary for other counseling recommendations.    Nolene BernheimERRI BURLESON, RN, MSN, NP-BC Nurse Practitioner, [facility]

## 2018-02-04 LAB — HIV ANTIBODY (ROUTINE TESTING W REFLEX): HIV SCREEN 4TH GENERATION: NONREACTIVE

## 2018-02-04 LAB — RPR: RPR Ser Ql: NONREACTIVE

## 2018-02-08 LAB — CERVICOVAGINAL ANCILLARY ONLY
BACTERIAL VAGINITIS: POSITIVE — AB
CANDIDA VAGINITIS: POSITIVE — AB
CHLAMYDIA, DNA PROBE: NEGATIVE
Neisseria Gonorrhea: NEGATIVE
Trichomonas: NEGATIVE

## 2018-02-08 LAB — CYTOLOGY - PAP: HPV: DETECTED — AB

## 2018-02-09 MED ORDER — FLUCONAZOLE 150 MG PO TABS: 150.0000 mg | ORAL_TABLET | Freq: Once | ORAL | 0 refills | Status: AC

## 2018-02-09 MED ORDER — METRONIDAZOLE 500 MG PO TABS: 500.0000 mg | ORAL_TABLET | Freq: Two times a day (BID) | ORAL | 0 refills | Status: DC

## 2018-02-09 NOTE — Progress Notes (Signed)
BV and yeast noted on wet prep.  Diflucan and metronidazole sent to her pharmacy and MyChart message sent to client.    Nolene BernheimERRI Isra Lindy, RN, MSN, NP-BC Nurse Practitioner, [facility], [facility] 02/09/2018 8:44 AM

## 2018-02-09 NOTE — Addendum Note (Signed)
Addended by: Currie ParisBURLESON, Aylana Hirschfeld L on: 02/09/2018 08:44 AM   Modules accepted: Orders

## 2018-02-19 ENCOUNTER — Telehealth: Payer: Self-pay

## 2018-02-19 NOTE — Telephone Encounter (Signed)
Called pt to give results of Pap, no answer,left VM for call back.

## 2018-02-19 NOTE — Telephone Encounter (Signed)
-----   Message from Currie Paris, NP sent at 02/19/2018  8:23 AM EST ----- Regarding: abn pap - needs colpo Please notify patient of pap results and schedule for colpo.  Thank you.

## 2018-02-22 ENCOUNTER — Telehealth: Payer: Self-pay

## 2018-02-22 NOTE — Telephone Encounter (Signed)
Called pt to advise of Pap results, no answer, left VM to call back.

## 2018-02-22 NOTE — Telephone Encounter (Signed)
Called patient & informed her of results and recommendations. Patient verbalized understanding and asked if it was the same procedure as last time. Explained colposcopy versus LEEP with patient and discussed right now only the colposcopy is needed. Patient verbalized understanding & had no questions. Patient will await phone call from front office staff with an appt.

## 2018-03-18 ENCOUNTER — Ambulatory Visit (INDEPENDENT_AMBULATORY_CARE_PROVIDER_SITE_OTHER): Payer: BLUE CROSS/BLUE SHIELD | Admitting: Obstetrics & Gynecology

## 2018-03-18 ENCOUNTER — Encounter: Payer: Self-pay | Admitting: Obstetrics & Gynecology

## 2018-03-18 ENCOUNTER — Other Ambulatory Visit (HOSPITAL_COMMUNITY)
Admission: RE | Admit: 2018-03-18 | Discharge: 2018-03-18 | Disposition: A | Discharge status: Home / Self Care | Payer: Medicaid Other | Source: Ambulatory Visit | Attending: Obstetrics & Gynecology | Admitting: Obstetrics & Gynecology

## 2018-03-18 VITALS — BP 117/77 | HR 80 | Ht 61.0 in | Wt 121.0 lb

## 2018-03-18 DIAGNOSIS — N871 Moderate cervical dysplasia: Secondary | ICD-10-CM | POA: Diagnosis not present

## 2018-03-18 DIAGNOSIS — Z3202 Encounter for pregnancy test, result negative: Secondary | ICD-10-CM | POA: Diagnosis not present

## 2018-03-18 DIAGNOSIS — N870 Mild cervical dysplasia: Secondary | ICD-10-CM | POA: Insufficient documentation

## 2018-03-18 LAB — POCT PREGNANCY, URINE: PREG TEST UR: NEGATIVE

## 2018-03-18 NOTE — Progress Notes (Signed)
Patient given informed consent, signed copy in the chart, time out was performed.  Placed in lithotomy position. Cervix viewed with speculum and colposcope after application of acetic acid.  02/03/2018 Satisfactory for evaluation endocervical/transformation zone component PRESENT.Abnormal     Diagnosis - LOW GRADE SQUAMOUS INTRAEPITHELIAL LESION: CIN-1/ HPV (LSIL)Abnormal    HPV DETECTEDAbnormal    Comment: Normal Reference Range - NOT Detected  Material Submitted CervicoVaginal Pap [ThinPrep Imaged]Abnormal    CYTOLOGY - PAP PAP RESULT    07/23/2015 Diagnosis Cervix, LEEP -CERVICAL TRANSFORMATION ZONE MUCOSA WITH CIN-II/III (MODERATE TO SEVERE SQUAMOUS DYSPLASIA/SQUAMOUS CARCINOMA IN SITU; HIGH GRADE SQUAMOUS INTRAEPITHELIAL LESION). -NO INVASIVE NEOPLASM IDENTIFIED. -MARGINS OF RESECTION ARE NEGATIVE FOR NEOPLASIA.  Colposcopy adequate?  yes Acetowhite lesions? yes Punctation? no Mosaicism?  no Abnormal vasculature?  no Biopsies? Yes at 6 and 12 o'clock ECC? no   Patient was given post procedure instructions. I suspect a love grade lesion.  Pt is in school at [employer]. She would like to get her results via Telephone or MyChart.      Makenna Macaluso L. Harraway-Smith, M.D., [facility]

## 2018-04-06 ENCOUNTER — Telehealth: Payer: Self-pay

## 2018-04-06 NOTE — Telephone Encounter (Signed)
Called pt to advise of test results & need for LEEP. No answer, left VM for call back.

## 2018-04-06 NOTE — Telephone Encounter (Signed)
-----   Message from Willodean Rosenthal, MD sent at 04/05/2018  2:49 PM EST ----- Please call pt. She needs a LEEP.   Thx, clh-S

## 2018-04-06 NOTE — Telephone Encounter (Signed)
Notified pt of her need for the LEEP.  Pt questioned why she needs the LEEP again.  I explained to the pt that her colposcopy showed that the biopsies that were taken were high grade so that warrants the need for a LEEP.  I also explained to the pt that if she is concerned about fertility the provider will preserve her fertility.   Pt then asked what about her IUD.  I informed that pt that the provider will work around that.  Pt stated understanding with no further questions.

## 2018-05-18 ENCOUNTER — Telehealth: Payer: Self-pay | Admitting: Family Medicine

## 2018-05-18 NOTE — Telephone Encounter (Signed)
Left detailed message that appt was canceled due to COVID 19

## 2018-05-24 ENCOUNTER — Ambulatory Visit: Payer: Medicaid Other | Admitting: Obstetrics & Gynecology

## 2018-05-24 ENCOUNTER — Telehealth: Payer: Self-pay | Admitting: Family Medicine

## 2018-05-24 NOTE — Telephone Encounter (Signed)
The patient called upset about the cancellation of her appointment. Explained the appointment was cancelled due to the COVID19. Also stated she has yeast infection symptoms however she is staying in [place] at the time and would like the [place]. A message was sent to the clinical team.

## 2018-05-25 ENCOUNTER — Telehealth: Payer: Self-pay | Admitting: *Deleted

## 2018-05-25 ENCOUNTER — Telehealth: Payer: Self-pay | Admitting: Medical

## 2018-05-25 DIAGNOSIS — N760 Acute vaginitis: Principal | ICD-10-CM

## 2018-05-25 DIAGNOSIS — B9689 Other specified bacterial agents as the cause of diseases classified elsewhere: Secondary | ICD-10-CM

## 2018-05-25 MED ORDER — METRONIDAZOLE 500 MG PO TABS: 500.0000 mg | ORAL_TABLET | Freq: Two times a day (BID) | ORAL | 0 refills | Status: DC

## 2018-05-25 NOTE — Telephone Encounter (Signed)
Returned pt's call regarding a possible yeast infection.  Pt did not pick up.  Left message advising pt that she was being contacted regarding her call to [place] and that if she continued to have questions or concerns to contact the clinic.

## 2018-05-25 NOTE — Telephone Encounter (Signed)
The patient called in today to speak with a nurse. She missed the call earlier today. Transferred the call to nurse Morrie Sheldon.

## 2018-05-25 NOTE — Telephone Encounter (Signed)
Pt reports concerns that she has a yeast infection.  When asked about her symptoms, pt reports fishy odor and discharge that is thin and white. Advised pt that symptoms are more likely BV and Flagyl ordered and sent to pt's requested pharmacy.  Advised pt to call the clinic if symptoms persist after the medication is finished or if they get worse. Pt verbalized understanding.

## 2018-08-17 ENCOUNTER — Telehealth: Payer: Self-pay | Admitting: Family Medicine

## 2018-08-17 NOTE — Telephone Encounter (Signed)
Spoke to patient about her appointment on 7/1 @ 2:20. Patient instructed to wear a face mask. Patient instructed that no visitors are allowed with her. Patient screened for covid symptoms and denied having any

## 2018-08-18 ENCOUNTER — Other Ambulatory Visit: Payer: Self-pay

## 2018-08-18 ENCOUNTER — Ambulatory Visit: Payer: BC Managed Care – PPO

## 2018-08-18 DIAGNOSIS — R8290 Unspecified abnormal findings in urine: Secondary | ICD-10-CM | POA: Diagnosis not present

## 2018-08-18 LAB — POCT URINALYSIS DIP (DEVICE)
Bilirubin Urine: NEGATIVE
Glucose, UA: NEGATIVE mg/dL
Hgb urine dipstick: NEGATIVE
Ketones, ur: NEGATIVE mg/dL
Nitrite: POSITIVE — AB
Protein, ur: NEGATIVE mg/dL
Specific Gravity, Urine: 1.025 (ref 1.005–1.030)
Urobilinogen, UA: 0.2 mg/dL (ref 0.0–1.0)
pH: 7 (ref 5.0–8.0)

## 2018-08-18 MED ORDER — NITROFURANTOIN MONOHYD MACRO 100 MG PO CAPS: 100.0000 mg | ORAL_CAPSULE | Freq: Two times a day (BID) | ORAL | 0 refills | Status: DC

## 2018-08-18 NOTE — Progress Notes (Signed)
Pt here today for UTI.  Pt reports having urgency to go to the bathroom and some abdominal pain.  Pt reports that [facility] medication for 3 days and that took away the abdominal pain.  I explained to the pt that we will send urine for a culture and that I will prescribed Macobid 100 mg po bid x 5 days per protocol.  I informed pt that it will take approximately 72 hrs for results and if we need to change antibiotic therapy.  Pt verbalized understanding.    Mel Almond, RN 08/18/18

## 2018-08-20 LAB — URINE CULTURE

## 2018-08-23 ENCOUNTER — Telehealth (INDEPENDENT_AMBULATORY_CARE_PROVIDER_SITE_OTHER): Payer: BC Managed Care – PPO | Admitting: *Deleted

## 2018-08-23 DIAGNOSIS — N390 Urinary tract infection, site not specified: Secondary | ICD-10-CM

## 2018-08-23 MED ORDER — CEPHALEXIN 500 MG PO CAPS: 500.0000 mg | ORAL_CAPSULE | Freq: Three times a day (TID) | ORAL | 0 refills | Status: DC

## 2018-08-23 NOTE — Telephone Encounter (Signed)
-----   Message from Chancy Milroy, MD sent at 08/23/2018  1:02 PM EDT ----- Keflex 500 mg po tid x 7 days for UTI Thanks Legrand Como

## 2018-08-23 NOTE — Telephone Encounter (Signed)
Called pt to inform her that she has a UTI and that the provider prescribed Keflex for her to take 3 times a day for 7 days.  Per pt request, [facility] [address] [facility] [place].  Pt verbalized understanding.

## 2018-09-15 ENCOUNTER — Other Ambulatory Visit (HOSPITAL_COMMUNITY)
Admission: RE | Admit: 2018-09-15 | Discharge: 2018-09-15 | Disposition: A | Discharge status: Home / Self Care | Payer: BC Managed Care – PPO | Source: Ambulatory Visit | Attending: Obstetrics & Gynecology | Admitting: Obstetrics & Gynecology

## 2018-09-15 ENCOUNTER — Ambulatory Visit (INDEPENDENT_AMBULATORY_CARE_PROVIDER_SITE_OTHER): Payer: BC Managed Care – PPO | Admitting: Obstetrics & Gynecology

## 2018-09-15 ENCOUNTER — Encounter: Payer: Self-pay | Admitting: Obstetrics & Gynecology

## 2018-09-15 ENCOUNTER — Other Ambulatory Visit: Payer: Self-pay

## 2018-09-15 VITALS — BP 116/75 | HR 58 | Ht 62.0 in | Wt 124.0 lb

## 2018-09-15 DIAGNOSIS — R87613 High grade squamous intraepithelial lesion on cytologic smear of cervix (HGSIL): Secondary | ICD-10-CM | POA: Insufficient documentation

## 2018-09-15 DIAGNOSIS — N871 Moderate cervical dysplasia: Secondary | ICD-10-CM | POA: Diagnosis not present

## 2018-09-15 DIAGNOSIS — Z3202 Encounter for pregnancy test, result negative: Secondary | ICD-10-CM | POA: Diagnosis not present

## 2018-09-15 DIAGNOSIS — Z01812 Encounter for preprocedural laboratory examination: Secondary | ICD-10-CM | POA: Diagnosis not present

## 2018-09-15 LAB — POCT URINE PREGNANCY: Preg Test, Ur: NEGATIVE

## 2018-09-15 NOTE — Progress Notes (Signed)
Pap smear and colposcopy reviewed.   Pap 02/03/2018  Satisfactory for evaluation endocervical/transformation zone component PRESENT.Abnormal     Diagnosis - LOW GRADE SQUAMOUS INTRAEPITHELIAL LESION: CIN-1/ HPV (LSIL)Abnormal    HPV DETECTEDAbnormal    Comment: Normal Reference Range - NOT Detected  Material Submitted CervicoVaginal Pap [ThinPrep Imaged]Abnormal    Colpo Biopsy 03/18/2018 Diagnosis Cervix, biopsy, 6 and 12 o'clock - HIGH GRADE SQUAMOUS INTRAEPITHELIAL LESION, CIN-II (MODERATE DYSPLASIA). ECC n/a  Risks, benefits, alternatives, and limitations of procedure explained to patient, including pain, bleeding, infection, failure to remove abnormal tissue and failure to cure dysplasia, need for repeat procedures, damage to pelvic organs, cervical incompetence. I reviewed this in detail as this is the pts 2nd LEEP. She explain need for cervical length and possible cerclage if she gets pregnant. Role of HPV,cervical dysplasia and need for close followup was empasized. Informed written consent was obtained. All questions were answered. Time out performed.  Procedure: The patient was placed in lithotomy position and the bivalved coated speculum was placed in the patient's vagina. A grounding pad placed on the patient. Local anesthesia was administered via an intracervical block using 10cc of 2% Lidocaine with epinephrine. The suction was turned on and the Large 1X Fisher Cone Biopsy Excisor on 1 Watts of cutting current was used to excise the area of decreased uptake and excise the entire transformation zone. Excellent hemostasis was achieved using roller ball coagulation set at 60 Watts coagulation current. Monsel's solution was then applied and excellent hemostasis was noted.  The speculum was removed from the vagina. Specimens were sent to pathology. The patient tolerated the procedure well. Post-operative instructions given to patient, including instruction to seek medical attention for  persistent bright red bleeding, fever, abdominal/pelvic pain, dysuria, nausea or vomiting. She was also told about the possibility of having copious yellow to black tinged discharge. She was counseled to avoid anything in the vagina (sex/douching/tampons) for 4 weeks. She has a  4 week post-operative check to review results and assess wound healing.   Akiva Josey L. Harraway-Smith, M.D., [facility] June

## 2018-09-15 NOTE — Patient Instructions (Signed)

## 2018-10-18 ENCOUNTER — Ambulatory Visit: Payer: BC Managed Care – PPO | Admitting: Obstetrics & Gynecology

## 2018-10-18 DIAGNOSIS — Z09 Encounter for follow-up examination after completed treatment for conditions other than malignant neoplasm: Secondary | ICD-10-CM

## 2018-10-20 ENCOUNTER — Telehealth: Payer: Self-pay | Admitting: Obstetrics & Gynecology

## 2018-10-20 NOTE — Telephone Encounter (Signed)
TC to pt. Left message to call back.   clh-S

## 2018-10-21 ENCOUNTER — Telehealth: Payer: Self-pay

## 2018-10-21 NOTE — Telephone Encounter (Signed)
Called pt regarding LEEP results. Pt made aware that per Dr. Ihor Dow,  LEEP margins are still positive. Pt is scheduled for f/u appt 10/22/18. Understanding was voiced.  Jane Soto l Jeanice Dempsey, CMA

## 2018-10-22 ENCOUNTER — Ambulatory Visit (INDEPENDENT_AMBULATORY_CARE_PROVIDER_SITE_OTHER): Payer: Self-pay | Admitting: Obstetrics & Gynecology

## 2018-10-22 ENCOUNTER — Other Ambulatory Visit: Payer: Self-pay

## 2018-10-22 ENCOUNTER — Encounter: Payer: Self-pay | Admitting: Obstetrics & Gynecology

## 2018-10-22 VITALS — BP 123/84 | HR 62 | Ht 62.0 in | Wt 125.0 lb

## 2018-10-22 DIAGNOSIS — Z9889 Other specified postprocedural states: Secondary | ICD-10-CM

## 2018-10-22 DIAGNOSIS — R87613 High grade squamous intraepithelial lesion on cytologic smear of cervix (HGSIL): Secondary | ICD-10-CM

## 2018-10-22 NOTE — Patient Instructions (Signed)
Hysterectomy Information  A hysterectomy is a surgery in which the uterus is removed. The fallopian tubes and ovaries may be removed (bilateral salpingo-oophorectomy) as well. This procedure may be done to treat various medical problems. After the procedure, a woman will no longer have menstrual periods nor will she be able to become pregnant (sterile). What are the reasons for a hysterectomy? There are many reasons why a woman might have this procedure. They include:  Persistent, abnormal vaginal bleeding.  Long-term (chronic) pelvic pain or infection.  Endometriosis. This is when the lining of the uterus (endometrium) starts to grow outside the uterus.  Adenomyosis. This is when the endometrium starts to grow in the muscle of the uterus.  Pelvic organ prolapse. This is a condition in which the uterus falls down into the vagina.  Noncancerous growths in the uterus (uterine fibroids) that cause symptoms.  The presence of precancerous cells.  Cervical or uterine cancer. What are the different types of hysterectomy? There are three different types of hysterectomy:  Supracervical hysterectomy. In this type, the top part of the uterus is removed, but not the cervix.  Total hysterectomy. In this type, the uterus and cervix are removed.  Radical hysterectomy. In this type, the uterus, the cervix, and the tissue that holds the uterus in place (parametrium) are removed. What are the different ways a hysterectomy can be performed? There are many different ways a hysterectomy can be performed, including:  Abdominal hysterectomy. In this type, an incision is made in the abdomen. The uterus is removed through this incision.  Vaginal hysterectomy. In this type, an incision is made in the vagina. The uterus is removed through this incision. There are no abdominal incisions.  Conventional laparoscopic hysterectomy. In this type, three or four small incisions are made in the abdomen. A thin,  lighted tube with a camera (laparoscope) is inserted into one of the incisions. Other tools are put through the other incisions. The uterus is cut into small pieces. The small pieces are removed through the incisions or through the vagina.  Laparoscopically assisted vaginal hysterectomy (LAVH). In this type, three or four small incisions are made in the abdomen. Part of the surgery is performed laparoscopically and the other part is done vaginally. The uterus is removed through the vagina.  Robot-assisted laparoscopic hysterectomy. In this type, a laparoscope and other tools are inserted into three or four small incisions in the abdomen. A computer-controlled device is used to give the surgeon a 3D image and to help control the surgical instruments. This allows for more precise movements of surgical instruments. The uterus is cut into small pieces and removed through the incisions or removed through the vagina. Discuss the options with your health care provider to determine which type is the right one for you. What are the risks? Generally, this is a safe procedure. However, problems may occur, including:  Bleeding and risk of blood transfusion. Tell your health care provider if you do not want to receive any blood products.  Blood clots in the legs or lung.  Infection.  Damage to other structures or organs.  Allergic reactions to medicines.  Changing to an abdominal hysterectomy from one of the other techniques. What to expect after a hysterectomy  You will be given pain medicine.  You may need to stay in the hospital for 1- 2 days to recover, depending on the type of hysterectomy you had.  Follow your health care provider's instructions about exercise, driving, and general activities. Ask your   health care provider what activities are safe for you.  You will need to have someone with you for the first 3-5 days after you go home.  You will need to follow up with your surgeon in 2-4  weeks after surgery to evaluate your progress.  If the ovaries are removed, you will have early menopause symptoms such as hot flashes, night sweats, and insomnia.  If you had a hysterectomy for a problem that was not cancer or not a condition that could lead to cancer, then you no longer need Pap tests. However, even if you no longer need a Pap test, a regular pelvic exam is a good idea to make sure no other problems are developing. Questions to ask your health care provider  Is a hysterectomy medically necessary? Do I have other treatment options for my condition?  What are my options for hysterectomy procedure?  What organs and tissues need to be removed?  What are the risks?  What are the benefits?  How long will I need to stay in the hospital after the procedure?  How long will I need to recover at home?  What symptoms can I expect after the procedure? Summary  A hysterectomy is a surgery in which the uterus is removed. The fallopian tubes and ovaries may be removed (bilateral salpingo-oophorectomy) as well.  This procedure may be done to treat various medical problems. After the procedure, a woman will no longer have menstrual periods nor will she be able to become pregnant.  Discuss the options with your health care provider to determine which type of hysterectomy is the right one for you. This information is not intended to replace advice given to you by your health care provider. Make sure you discuss any questions you have with your health care provider. Document Released: 07/30/2000 Document Revised: 01/16/2017 Document Reviewed: 03/12/2016 Elsevier Patient Education  2020 Elsevier Inc.  

## 2018-10-22 NOTE — Progress Notes (Signed)
Pt has no complaints

## 2018-10-22 NOTE — Progress Notes (Signed)
,   History:  28 y.o. [id number] here today for exam post LEEP. Pt reports no pain or bleeding She has not been sexually active since the procedure. She has had no menses but, this is not unusual since she has a LnIUD.   The following portions of the patient's history were reviewed and updated as appropriate: allergies, current medications, past family history, past medical history, past social history, past surgical history and problem list.  Review of Systems:  Pertinent items are noted in HPI.    Objective:  Physical Exam Blood pressure 123/84, pulse 62, height 5\' 2"  (1.575 m), weight 125 lb (56.7 kg).  CONSTITUTIONAL: Well-developed, well-nourished female in no acute distress.  HENT:  Normocephalic, atraumatic EYES: Conjunctivae and EOM are normal. No scleral icterus.  NECK: Normal range of motion SKIN: Skin is warm and dry. No rash noted. Not diaphoretic.No pallor. [place]: Alert and oriented to person, place, and time. Normal coordination.  Abd: Soft, nontender and nondistended Pelvic: Normal appearing external genitalia; normal appearing vaginal mucosa.  and cervix.  Increased white discharge. cervix is tender over the area where the LEEP was obtained. Not friable. Silver Nitrate applied.    Labs and Imaging 09/15/2018 Diagnosis Cervix, LEEP - HIGH GRADE SQUAMOUS INTRAEPITHELIAL LESION, CIN-2 (MODERATE DYSPLASIA). - ENDOCERVICAL AND ECTOCERVICAL MARGINS FOCALLY INVOLVED.  Assessment & Plan:  Post LEEP check  Pt is doing well  Reviewed surg path. Margins remains positive.   I have reviewed in detail the continued positive margins and the managemnt options including repeat PAP in 6 months to assess resolution; repeat LEEP and the potential risks to a subsequent pregnancy and hysterectomy. At present I believe a repeat PAP is the most conservative option at her age. She is undecided about future pregnancies but, she does not want the potential of cancer. She is amenable to waiting  but, if the repeat PAP and bx is high grade, she reports that she is leaning toward a hysterectomy. Info given for pt to review for her records She has a h/o 2 prior c-sections.    F/u in 6 months for repeat PAP or sooner prn.   Kengo Sturges L. Harraway-Smith, M.D., [place] June

## 2018-10-29 ENCOUNTER — Telehealth: Payer: Self-pay

## 2018-10-29 NOTE — Telephone Encounter (Signed)
Patient called stating she needed a letter for school for her absence on Sept 4 when she was in the office for LEEP. Kathrene Alu RN

## 2019-02-25 ENCOUNTER — Other Ambulatory Visit: Payer: Self-pay

## 2019-02-25 DIAGNOSIS — N390 Urinary tract infection, site not specified: Secondary | ICD-10-CM

## 2019-02-28 ENCOUNTER — Telehealth: Payer: BLUE CROSS/BLUE SHIELD | Admitting: Family

## 2019-02-28 DIAGNOSIS — N760 Acute vaginitis: Secondary | ICD-10-CM

## 2019-02-28 DIAGNOSIS — B9689 Other specified bacterial agents as the cause of diseases classified elsewhere: Secondary | ICD-10-CM

## 2019-02-28 MED ORDER — METRONIDAZOLE 500 MG PO TABS: 500.0000 mg | ORAL_TABLET | Freq: Two times a day (BID) | ORAL | 0 refills | Status: DC

## 2019-02-28 NOTE — Progress Notes (Signed)

## 2019-07-04 ENCOUNTER — Ambulatory Visit: Payer: Medicaid Other | Admitting: Obstetrics & Gynecology

## 2019-08-15 DIAGNOSIS — Z23 Encounter for immunization: Secondary | ICD-10-CM | POA: Diagnosis not present

## 2019-09-26 ENCOUNTER — Other Ambulatory Visit (HOSPITAL_COMMUNITY)
Admission: RE | Admit: 2019-09-26 | Discharge: 2019-09-26 | Disposition: A | Discharge status: Home / Self Care | Payer: Medicaid Other | Source: Ambulatory Visit | Attending: Obstetrics & Gynecology | Admitting: Obstetrics & Gynecology

## 2019-09-26 ENCOUNTER — Other Ambulatory Visit: Payer: Self-pay

## 2019-09-26 ENCOUNTER — Encounter: Payer: Self-pay | Admitting: Obstetrics & Gynecology

## 2019-09-26 ENCOUNTER — Ambulatory Visit (INDEPENDENT_AMBULATORY_CARE_PROVIDER_SITE_OTHER): Payer: Medicaid Other | Admitting: Obstetrics & Gynecology

## 2019-09-26 VITALS — BP 129/81 | HR 64 | Ht 62.0 in | Wt 136.0 lb

## 2019-09-26 DIAGNOSIS — L750 Bromhidrosis: Secondary | ICD-10-CM | POA: Diagnosis not present

## 2019-09-26 DIAGNOSIS — A630 Anogenital (venereal) warts: Secondary | ICD-10-CM | POA: Diagnosis not present

## 2019-09-26 DIAGNOSIS — N871 Moderate cervical dysplasia: Secondary | ICD-10-CM | POA: Diagnosis present

## 2019-09-26 DIAGNOSIS — R300 Dysuria: Secondary | ICD-10-CM | POA: Diagnosis not present

## 2019-09-26 DIAGNOSIS — Z01419 Encounter for gynecological examination (general) (routine) without abnormal findings: Secondary | ICD-10-CM | POA: Diagnosis not present

## 2019-09-26 LAB — POCT URINALYSIS DIPSTICK
Bilirubin, UA: NEGATIVE
Glucose, UA: NEGATIVE
Protein, UA: NEGATIVE
Spec Grav, UA: 1.01 (ref 1.010–1.025)
Urobilinogen, UA: 0.2 E.U./dL
pH, UA: 6.5 (ref 5.0–8.0)

## 2019-09-26 NOTE — Progress Notes (Signed)
Subjective:     Jane Soto is a 29 y.o. female here for a routine exam.G3P2012 with h/o c/s x 2  Current complaints: Pt with h/o abnormal PAP and is s/p LEEP x 2  with positive margins.  Pt reports that she has a strong smell to her urine. She reports that this occurs several times a year. She also reports htat she has genital warts near her anus. These are new. She reports that it is uncomfortable when she wipes.    Gynecologic History Patient's last menstrual period was 09/25/2019 (exact date). Contraception: IUD Last Pap: 09/03/2017  Satisfactory for evaluation endocervical/transformation zone component PRESENT.Abnormal    Diagnosis - LOW GRADE SQUAMOUS INTRAEPITHELIAL LESION: CIN-1/ HPV (LSIL)Abnormal   HPV DETECTEDAbnormal      LEEP 08/2018 Diagnosis Cervix, LEEP - HIGH GRADE SQUAMOUS INTRAEPITHELIAL LESION, CIN-2 (MODERATE DYSPLASIA). - ENDOCERVICAL AND ECTOCERVICAL MARGINS FOCALLY INVOLVED. Last mammogram: n/a.   Obstetric History OB History  Gravida Para Term Preterm AB Living  3 2 2  0 1 2  SAB TAB Ectopic Multiple Live Births  1 0 0 0 2    # Outcome Date GA Lbr Len/2nd Weight Sex Delivery Anes PTL Lv  3 Term 05/02/13 [redacted]w[redacted]d  6 lb 11.4 oz (3.045 kg) M CS-LVertical Spinal  LIV  2 SAB           1 Term      CS-LTranv   LIV   The following portions of the patient's history were reviewed and updated as appropriate: allergies, current medications, past family history, past medical history, past social history, past surgical history and problem list.  Review of Systems Pertinent items are noted in HPI.    Objective:  BP 129/81   Pulse 64   Ht 5\' 2"  (1.575 m)   Wt 136 lb (61.7 kg)   LMP 09/25/2019 (Exact Date)   BMI 24.87 kg/m  General Appearance:    Alert, cooperative, no distress, appears stated age  Head:    Normocephalic, without obvious abnormality, atraumatic  Eyes:    conjunctiva/corneas clear, EOM's intact, both eyes  Ears:    Normal external ear  canals, both ears  Nose:   Nares normal, septum midline, mucosa normal, no drainage    or sinus tenderness  Throat:   Lips, mucosa, and tongue normal; teeth and gums normal  Neck:   Supple, symmetrical, trachea midline, no adenopathy;    thyroid:  no enlargement/tenderness/nodules  Back:     Symmetric, no curvature, ROM normal, no CVA tenderness  Lungs:     respirations unlabored  Chest Wall:    No tenderness or deformity   Heart:    Regular rate and rhythm  Breast Exam:    No tenderness, masses, or nipple abnormality  Abdomen:     Soft, non-tender, bowel sounds active all four quadrants,    no masses, no organomegaly  Genitalia:    Normal female without lesion, discharge or tenderness   Blood in vault. Uterus is small and mobile. No adnexal masses. There is a new area of condyloma around the anus on the left side.       Extremities:   Extremities normal, atraumatic, no cyanosis or edema  Pulses:   2+ and symmetric all extremities  Skin:   Skin color, texture, turgor normal, no rashes or lesions   OFFICE PROCEDURE: Patient identified, informed consent signed and copy in chart, time out performed.    Areas of typical appearing genital warts noted on the left side  of the anus. TCA applied until warts had white appearance.  Baking soda poultice applied.  Patient tolerated the procedure well.    Post procedure instructions given and patient told to wash area in thirty minutes.  UA: +blood and ketone and leuk (pt is on menses)     Assessment:    Healthy female exam.   H/o severe cervical dysplasia with positive margins after LEEP x 2 (2017 and 2020).  Genital warts- treated with TCA today    Plan:  Diagnoses and all orders for this visit:  Well female exam with routine gynecological exam -     Cytology - PAP( [facility])  Dysplasia of cervix, high grade CIN 2 -     Cytology - PAP( [facility])  Genital warts  Urinary body odor -     POCT Urinalysis Dipstick -     Urine  Culture  Return in 3 weeks for next treatment of TCA. I discussed with pt the positive margins. She wants to proceed with hysterectomy if the margins are still positive. We will discuss this once the results of the PAP are in.   Deklyn Gibbon L. Erin Fulling, M.D., FACOG   .

## 2019-09-26 NOTE — Patient Instructions (Signed)
Cervical Dysplasia  Cervical dysplasia is a condition in which a woman's cervix cells have abnormal changes. The cervix is the opening of the uterus (womb). It is located between the vagina and the uterus. Cervical dysplasia may be an early sign of cervical cancer. If left untreated, this condition may become more severe and may progress to cervical cancer. Early detection, treatment, and follow-up care are very important. What are the causes? Cervical dysplasia can be caused by a human papillomavirus (HPV) infection. HPV is the most common sexually transmitted infection (STI). HPV is spread from person to person through sexual contact. This includes oral, vaginal, or anal sex. What increases the risk? The following factors may make you more likely to develop this condition:  Having had a sexually transmitted infection (STI), such as herpes, chlamydia or HPV.  Becoming sexually active before age 18.  Having had more than one sexual partner.  Having a sexual partner who has multiple sexual partners.  Not using protection, such as a condom, during sex, especially with new sexual partners.  Having a history of cancer of the vagina or vulva.  Having a weakened body defense (immune) system.  Being the daughter of a woman who took diethylstilbestrol (DES) during pregnancy.  Having a family history of cervical cancer.  Smoking.  Using oral contraceptives, also called birth control pills.  Having had three or more full-term pregnancies. What are the signs or symptoms? There are usually no symptoms of this condition. If you do have symptoms, they may include:  Abnormal vaginal discharge.  Bleeding between periods or after sex.  Bleeding during menopause.  Pain during sex (dyspareunia). How is this diagnosed? A test called a Pap test may be done. During this test, cells are taken from the cervix and then examined under a microscope. A test in which tissue is removed from the cervix  (biopsy) may also be done if the Pap test is abnormal or if the cervix looks abnormal. How is this treated? Treatment varies based on the severity of the condition. Treatment may include:  Cryotherapy. During cryotherapy, the abnormal cells are frozen with a steel-tip instrument.  Loop electrosurgical excision procedure (LEEP). This procedure removes abnormal tissue from the cervix.  Surgery to remove abnormal tissue. This is usually done in more advanced cases. Surgical options include: ? A cone biopsy. This is a procedure in which the cervical canal and a portion of the center of the cervix are removed. ? Hysterectomy. This is a surgery in which the uterus and cervix are removed. Follow these instructions at home:  Take over-the-counter and prescription medicines only as told by your health care provider.  Do not use tampons, have sex, or douche until your health care provider says it is okay.  Keep follow-up visits as told by your health care provider. This is important. Women who have been treated for cervical dysplasia should have regular pelvic exams and Pap tests as told by their health care provider. How is this prevented?  Practice safe sex to help prevent sexually transmitted infections (STI) that may cause this condition.  Have regular Pap tests. Talk with your health care provider about how often you need these tests. Pap tests will help identify cell changes that can lead to cancer. Contact a health care provider if:  You develop genital warts. Get help right away if:  You have a fever.  You have abnormal vaginal discharge.  Your menstrual period is heavier than normal.  You develop bright red bleeding.   This may include blood clots.  You have pain or cramps that get worse, and medicine does not help to relieve your pain.  You feel light-headed and you are unusually weak.  You have fainting spells.  You have pain in the abdomen. Summary  Cervical dysplasia is  a condition in which a woman's cervix cells have abnormal changes.  If left untreated, this condition may become more severe and may progress to cervical cancer.  Early detection, treatment, and follow-up care are very important in managing this condition.  Have regular pelvic exams and Pap tests. Talk with your health care provider about how often you need these tests. Pap tests will help identify cell changes that can lead to cancer. This information is not intended to replace advice given to you by your health care provider. Make sure you discuss any questions you have with your health care provider. Document Revised: 11/25/2017 Document Reviewed: 02/07/2016 Elsevier Patient Education  2020 [employer] Genital Warts Genital warts are a common STD (sexually transmitted disease). They may appear as small bumps on the skin of the genital and anal areas. They sometimes become irritated and cause pain. Genital warts are easily passed to other people through sexual contact. Many people do not know that they are infected. They may be infected for years without symptoms. However, even if they do not have symptoms, they can pass the infection to their sexual partners. Getting treatment is important because genital warts can lead to other problems. In females, the virus that causes genital warts may increase the risk for cervical cancer. What are the causes? This condition is caused by a virus that is called human papillomavirus (HPV). HPV is spread by having unprotected sex with an infected person. It can be spread through vaginal, anal, and oral sex. What increases the risk? You are more likely to develop this condition if:  You have unprotected sex.  You have multiple sexual partners.  You are sexually active before age 65.  You are a man who isnot circumcised.  You have a female sexual partner who is not circumcised.  You have a weakened body defense system (immune system) due to disease  or medicine.  You smoke. What are the signs or symptoms? Symptoms of this condition include:  Small growths in the genital area or anal area. These warts often grow in clusters.  Itching and irritation in the genital area or anal area.  Bleeding from the warts.  Pain during sex. How is this diagnosed? This condition is diagnosed based on:  Your symptoms.  A physical exam. You may also have other tests, including:  Biopsy. A tissue sample is removed so it can be checked under a microscope.  Colposcopy. In females, a magnifying tool is used to examine the vagina and cervix. Certain solutions may be used to make the HPV cells change color so they can be seen more easily.  A Pap test in females.  Tests for other STDs. How is this treated? This condition may be treated with:  Medicines, such as: ? Solutions or creams applied to your skin (topical).  Procedures, such as: ? Freezing the warts with liquid nitrogen (cryotherapy). ? Burning the warts with a laser or electric probe (electrocautery). ? Surgery to remove the warts. Follow these instructions at home: Medicines   Apply over-the-counter and prescription medicines only as told by your health care provider.  Do not treat genital warts with medicines that are used for treating hand warts.  Talk  with your health care provider about using over-the-counter anti-itch creams. Instructions for women  Get screened regularly for cervical cancer. Women who have genital warts are at an increased risk for this cancer. This type of cancer is slow growing and can be cured if it is found early.  If you become pregnant, tell your health care provider that you have had an HPV infection. Your health care provider will monitor you closely during pregnancy to be sure that your baby is safe. General instructions  Do not touch or scratch the warts.  Do not have sex until your treatment has been completed.  Tell your current and  past sexual partners about your condition because they may also need treatment.  After treatment, use condoms during sex to prevent future infections.  Keep all follow-up visits as told by your health care provider. This is important. How is this prevented? Talk with your health care provider about getting the HPV vaccine. The vaccine:  Can prevent some HPV infections and cancers.  Is recommended for males and females who are 40-18 years old.  Is not recommended for pregnant women.  Will not work if you already have HPV. Contact a health care provider if you:  Have redness, swelling, or pain in the area of the treated skin.  Have a fever.  Feel generally ill.  Feel lumps in and around your genital or anal area.  Have bleeding in your genital or anal area.  Have pain during sex. Summary  Genital warts are a common STD (sexually transmitted disease). It may appear as small bumps on the genital and anal areas.  This condition is caused by a virus that is called human papillomavirus (HPV). HPV is spread by having unprotected sex with an infected person. It can be spread through vaginal, anal, and oral sex.  Treatment is important because genital warts can lead to other problems. In females, the virus that causes genital warts may increase the risk for cervical cancer.  This condition may be treated with medicine that is applied to the skin, or procedures to remove the warts.  The HPV vaccine can prevent some HPV infections and cancers. It is recommended that the vaccine be given to males and females who are 24-69 years old. This information is not intended to replace advice given to you by your health care provider. Make sure you discuss any questions you have with your health care provider. Document Revised: 03/10/2017 Document Reviewed: 03/10/2017 Elsevier Patient Education  2020 [employer].

## 2019-09-28 LAB — URINE CULTURE

## 2019-09-28 LAB — CYTOLOGY - PAP
Comment: NEGATIVE
Diagnosis: NEGATIVE
High risk HPV: NEGATIVE

## 2019-10-26 ENCOUNTER — Ambulatory Visit: Payer: Medicaid Other | Admitting: Obstetrics & Gynecology

## 2019-11-16 ENCOUNTER — Other Ambulatory Visit: Payer: Self-pay | Admitting: Obstetrics & Gynecology

## 2019-11-16 DIAGNOSIS — A630 Anogenital (venereal) warts: Secondary | ICD-10-CM

## 2019-11-16 MED ORDER — IMIQUIMOD 5 % EX CREA: TOPICAL_CREAM | CUTANEOUS | 5 refills | Status: DC

## 2020-02-03 ENCOUNTER — Ambulatory Visit: Payer: Medicaid Other | Admitting: Obstetrics & Gynecology

## 2020-02-29 ENCOUNTER — Encounter: Payer: Self-pay | Admitting: Obstetrics & Gynecology

## 2020-02-29 ENCOUNTER — Ambulatory Visit (INDEPENDENT_AMBULATORY_CARE_PROVIDER_SITE_OTHER): Payer: Medicaid Other | Admitting: Obstetrics & Gynecology

## 2020-02-29 ENCOUNTER — Other Ambulatory Visit: Payer: Self-pay

## 2020-02-29 VITALS — BP 112/76 | HR 76 | Wt 140.0 lb

## 2020-02-29 DIAGNOSIS — Z3009 Encounter for other general counseling and advice on contraception: Secondary | ICD-10-CM

## 2020-02-29 DIAGNOSIS — A630 Anogenital (venereal) warts: Secondary | ICD-10-CM | POA: Diagnosis not present

## 2020-02-29 NOTE — Progress Notes (Signed)
History:  30 y.o. [id number] here today for reeval of genital warts. Pt reports that after her last TCA treatment, she used the Aldara but, she only had to use it twice. She was worried that she would not be able to come in for repeated TCA so she requested the Aldara but, it was not needed. She wants to have her LnIUD removed and replaced in Mar. She is at 7 years in April but, will not be able to come to [facility] in Apr. She does not want to get it today because she is worried about the cramping.    The following portions of the patient's history were reviewed and updated as appropriate: allergies, current medications, past family history, past medical history, past social history, past surgical history and problem list.  Review of Systems:  Pertinent items are noted in HPI.    Objective:  Physical Exam Blood pressure 112/76, pulse 76, weight 140 lb (63.5 kg), last menstrual period 02/09/2020.  CONSTITUTIONAL: Well-developed, well-nourished female in no acute distress.  HENT:  Normocephalic, atraumatic EYES: Conjunctivae and EOM are normal. No scleral icterus.  NECK: Normal range of motion SKIN: Skin is warm and dry. No rash noted. Not diaphoretic.No pallor. NEUROLGIC: Alert and oriented to person, place, and time. Normal coordination.  Pelvic: Normal appearing external genitalia; no evidence of genital warts or lesions.    Assessment & Plan:  Genital warts  Lesions resolved.   F/u as prev reviewed for annual PAP testing.   Contraception counseling  Pt will get LnIUD removed and replaced in Apr.   Her stings were cut at the time of the LEEP so may need a paracervical nerve block.   Reviewed options for removal    Total face-to-face time with patient was 20 min.  Greater than 50% was spent in counseling and coordination of care with the patient.  Caelen Reierson L. Harraway-Smith, M.D., [facility]

## 2020-04-27 ENCOUNTER — Ambulatory Visit (INDEPENDENT_AMBULATORY_CARE_PROVIDER_SITE_OTHER): Payer: Medicaid Other | Admitting: Obstetrics & Gynecology

## 2020-04-27 ENCOUNTER — Encounter: Payer: Self-pay | Admitting: *Deleted

## 2020-04-27 ENCOUNTER — Other Ambulatory Visit: Payer: Self-pay

## 2020-04-27 ENCOUNTER — Encounter: Payer: Self-pay | Admitting: Obstetrics & Gynecology

## 2020-04-27 VITALS — BP 116/70 | HR 59 | Wt 139.0 lb

## 2020-04-27 DIAGNOSIS — T8332XA Displacement of intrauterine contraceptive device, initial encounter: Secondary | ICD-10-CM

## 2020-04-27 DIAGNOSIS — Z30433 Encounter for removal and reinsertion of intrauterine contraceptive device: Secondary | ICD-10-CM | POA: Diagnosis not present

## 2020-04-27 MED ORDER — DOXYCYCLINE HYCLATE 100 MG PO CAPS
100.0000 mg | ORAL_CAPSULE | Freq: Two times a day (BID) | ORAL | 0 refills | Status: DC
Start: 2020-04-27 — End: 2020-05-08

## 2020-04-27 NOTE — Progress Notes (Signed)
  History:  30 y.o. [id number] here today for IUD removal and reinsertion. Pt has IUD placed post c/s and has to have it removed due to 'inflammation'. She has had her current IUD for 7 years. She is s/p IUD and the strings were lost at that time.   The following portions of the patient's history were reviewed and updated as appropriate: allergies, current medications, past family history, past medical history, past social history, past surgical history and problem list.  Review of Systems:  Pertinent items are noted in HPI.    Objective:  Physical Exam Blood pressure 116/70, pulse (!) 59, weight 139 lb (63 kg).  CONSTITUTIONAL: Well-developed, well-nourished female in no acute distress.  HENT:  Normocephalic, atraumatic EYES: Conjunctivae and EOM are normal. No scleral icterus.  NECK: Normal range of motion SKIN: Skin is warm and dry. No rash noted. Not diaphoretic.No pallor. NEUROLGIC: Alert and oriented to person, place, and time. Normal coordination.  Pelvic: Normal appearing external genitalia; normal appearing vaginal mucosa and cervix.  Normal discharge.  Small uterus, no other palpable masses, no uterine or adnexal tenderness. The IUD strings were not noted.    GYN procedure: Patient was in the dorsal lithotomy position, exam as above. The cervix was swabbed with Betadine using scopettes. The strings of the IUD were not seen. As attempt was made to insert a IUD extractor but this was uncomfortable. A paracervical nerve block was performed using 2% lidocaine. 16cc total was used. (initiall 10cc followed by 6cc 40 min later).  Using the IUD extractor, multiple attempts were made to remove the IUD. US guidance was then performed. The IUD was noted but, I was unable to hook the IUD. The procedure was stopped due to discomfort. Patient tolerated the procedure well.      Assessment & Plan:  Retained IUD/Lost IUD strings:  Patient desires surgical management with hysteroscopic removal of  IUD and IUD reinsertion in the OR. Pt offered in ofc procedure and she declined. The risks of surgery were discussed in detail with the patient including but not limited to: bleeding which may require transfusion or reoperation; infection which may require prolonged hospitalization or re-hospitalization and antibiotic therapy; injury to bowel, bladder, ureters and major vessels or other surrounding organs; need for additional procedures including laparotomy; thromboembolic phenomenon, incisional problems and other postoperative or anesthesia complications.  Patient was told that the likelihood that her condition and symptoms will be treated effectively with this surgical management was very high; the postoperative expectations were also discussed in detail. The patient also understands the alternative treatment options which were discussed in full. All questions were answered.  She was told that she will be contacted by our surgical scheduler regarding the time and date of her surgery; routine preoperative instructions of having nothing to eat or drink after midnight on the day prior to surgery and also coming to the hospital 1 1/2 hours prior to her time of surgery were also emphasized.  She was told she may be called for a preoperative appointment about a week prior to surgery and will be given further preoperative instructions at that visit. Printed patient education handouts about the procedure were given to the patient to review at home.  Doxycycline 100mg  bid x 5 days.   Pedro Whiters L. Harraway-Smith, M.D., 

## 2020-05-02 ENCOUNTER — Encounter (HOSPITAL_BASED_OUTPATIENT_CLINIC_OR_DEPARTMENT_OTHER): Payer: Self-pay | Admitting: Obstetrics & Gynecology

## 2020-05-02 ENCOUNTER — Other Ambulatory Visit: Payer: Self-pay

## 2020-05-02 NOTE — Progress Notes (Signed)
Spoke w/ via phone for pre-op interview--- Pt Lab needs dos---- Urine preg              Lab results------ no COVID test ------ 05-04-2020 @ 1135 Arrive at ------- 0530 on 05-08-2020 NPO after MN NO Solid Food.  Clear liquids from MN until--- 0430 Med rec completed Medications to take morning of surgery ----- NONE Diabetic medication ----- n/a Patient instructed to bring photo id and insurance card day of surgery Patient aware to have Driver (ride ) / caregiver    for 24 hours after surgery --- boyfriend, Jane Soto Patient Special Instructions ----- n/a Pre-Op special Istructions ----- pt speaks and understands english very well.  She stated she does not need interpreter dos. Patient verbalized understanding of instructions that were given at this phone interview. Patient denies shortness of breath, chest pain, fever, cough at this phone interview.

## 2020-05-04 ENCOUNTER — Other Ambulatory Visit (HOSPITAL_COMMUNITY)
Admission: RE | Admit: 2020-05-04 | Discharge: 2020-05-04 | Disposition: A | Discharge status: Home / Self Care | Payer: Medicaid Other | Source: Ambulatory Visit | Attending: Obstetrics & Gynecology | Admitting: Obstetrics & Gynecology

## 2020-05-04 DIAGNOSIS — Z01812 Encounter for preprocedural laboratory examination: Secondary | ICD-10-CM | POA: Insufficient documentation

## 2020-05-04 DIAGNOSIS — Z20822 Contact with and (suspected) exposure to covid-19: Secondary | ICD-10-CM | POA: Diagnosis not present

## 2020-05-04 LAB — SARS CORONAVIRUS 2 (TAT 6-24 HRS): SARS Coronavirus 2: NEGATIVE

## 2020-05-07 NOTE — Anesthesia Preprocedure Evaluation (Addendum)
Anesthesia Evaluation  Patient identified by MRN, date of birth, ID band Patient awake    Reviewed: Allergy & Precautions, NPO status , Patient's Chart, lab work & pertinent test results  Airway Mallampati: I       Dental no notable dental hx.    Pulmonary    Pulmonary exam normal        Cardiovascular negative cardio ROS Normal cardiovascular exam     Neuro/Psych  Headaches, negative psych ROS   GI/Hepatic negative GI ROS, Neg liver ROS,   Endo/Other  negative endocrine ROS  Renal/GU negative Renal ROS  negative genitourinary   Musculoskeletal negative musculoskeletal ROS (+)   Abdominal Normal abdominal exam  (+)   Peds  Hematology negative hematology ROS (+)   Anesthesia Other Findings   Reproductive/Obstetrics                            Anesthesia Physical Anesthesia Plan  ASA: I  Anesthesia Plan: General   Post-op Pain Management:    Induction: Intravenous  PONV Risk Score and Plan: 4 or greater and Ondansetron, Dexamethasone and Midazolam  Airway Management Planned: LMA  Additional Equipment: None  Intra-op Plan:   Post-operative Plan: Extubation in OR  Informed Consent: I have reviewed the patients History and Physical, chart, labs and discussed the procedure including the risks, benefits and alternatives for the proposed anesthesia with the patient or authorized representative who has indicated his/her understanding and acceptance.     Dental advisory given  Plan Discussed with: CRNA  Anesthesia Plan Comments:        Anesthesia Quick Evaluation

## 2020-05-08 ENCOUNTER — Encounter (HOSPITAL_BASED_OUTPATIENT_CLINIC_OR_DEPARTMENT_OTHER)
Admission: RE | Disposition: A | Discharge status: Home / Self Care | Payer: Self-pay | Source: Home / Self Care | Attending: Obstetrics & Gynecology

## 2020-05-08 ENCOUNTER — Other Ambulatory Visit: Payer: Self-pay

## 2020-05-08 ENCOUNTER — Encounter (HOSPITAL_BASED_OUTPATIENT_CLINIC_OR_DEPARTMENT_OTHER): Payer: Self-pay | Admitting: Obstetrics & Gynecology

## 2020-05-08 ENCOUNTER — Ambulatory Visit (HOSPITAL_BASED_OUTPATIENT_CLINIC_OR_DEPARTMENT_OTHER): Payer: Medicaid Other | Admitting: Anesthesiology

## 2020-05-08 ENCOUNTER — Ambulatory Visit (HOSPITAL_BASED_OUTPATIENT_CLINIC_OR_DEPARTMENT_OTHER)
Admission: RE | Admit: 2020-05-08 | Discharge: 2020-05-08 | Disposition: A | Discharge status: Home / Self Care | Payer: Medicaid Other | Attending: Obstetrics & Gynecology | Admitting: Obstetrics & Gynecology

## 2020-05-08 DIAGNOSIS — Z30430 Encounter for insertion of intrauterine contraceptive device: Secondary | ICD-10-CM

## 2020-05-08 DIAGNOSIS — Z8249 Family history of ischemic heart disease and other diseases of the circulatory system: Secondary | ICD-10-CM | POA: Insufficient documentation

## 2020-05-08 DIAGNOSIS — T8389XA Other specified complication of genitourinary prosthetic devices, implants and grafts, initial encounter: Secondary | ICD-10-CM | POA: Insufficient documentation

## 2020-05-08 DIAGNOSIS — Z793 Long term (current) use of hormonal contraceptives: Secondary | ICD-10-CM | POA: Insufficient documentation

## 2020-05-08 DIAGNOSIS — T8332XA Displacement of intrauterine contraceptive device, initial encounter: Secondary | ICD-10-CM

## 2020-05-08 DIAGNOSIS — Z30433 Encounter for removal and reinsertion of intrauterine contraceptive device: Secondary | ICD-10-CM | POA: Diagnosis not present

## 2020-05-08 DIAGNOSIS — Z9882 Breast implant status: Secondary | ICD-10-CM | POA: Insufficient documentation

## 2020-05-08 DIAGNOSIS — X58XXXA Exposure to other specified factors, initial encounter: Secondary | ICD-10-CM | POA: Diagnosis not present

## 2020-05-08 DIAGNOSIS — T8339XA Other mechanical complication of intrauterine contraceptive device, initial encounter: Secondary | ICD-10-CM

## 2020-05-08 DIAGNOSIS — Z833 Family history of diabetes mellitus: Secondary | ICD-10-CM | POA: Insufficient documentation

## 2020-05-08 HISTORY — DX: Personal history of cervical dysplasia: Z87.410

## 2020-05-08 HISTORY — PX: INTRAUTERINE DEVICE (IUD) INSERTION: SHX5877

## 2020-05-08 HISTORY — DX: Personal history of other infectious and parasitic diseases: Z86.19

## 2020-05-08 HISTORY — DX: Other mechanical complication of intrauterine contraceptive device, initial encounter: T83.39XA

## 2020-05-08 HISTORY — PX: HYSTEROSCOPY WITH D & C: SHX1775

## 2020-05-08 LAB — POCT PREGNANCY, URINE: Preg Test, Ur: NEGATIVE

## 2020-05-08 SURGERY — DILATATION AND CURETTAGE /HYSTEROSCOPY
Anesthesia: General | Site: Uterus

## 2020-05-08 MED ORDER — MEPERIDINE HCL 25 MG/ML IJ SOLN: 6.2500 mg | INTRAMUSCULAR | Status: DC | PRN

## 2020-05-08 MED ORDER — MIDAZOLAM HCL 5 MG/5ML IJ SOLN
INTRAMUSCULAR | Status: DC | PRN
  Administered 2020-05-08: 2 mg via INTRAVENOUS

## 2020-05-08 MED ORDER — OXYCODONE HCL 5 MG PO TABS
5.0000 mg | ORAL_TABLET | Freq: Once | ORAL | Status: DC | PRN
Start: 2020-05-08 — End: 2020-05-08

## 2020-05-08 MED ORDER — BUPIVACAINE HCL (PF) 0.5 % IJ SOLN
INTRAMUSCULAR | Status: DC | PRN
  Administered 2020-05-08: 10 mL

## 2020-05-08 MED ORDER — LACTATED RINGERS IV SOLN
INTRAVENOUS | Status: DC
  Administered 2020-05-08: 1000 mL via INTRAVENOUS

## 2020-05-08 MED ORDER — ACETAMINOPHEN 325 MG PO TABS: 325.0000 mg | ORAL_TABLET | ORAL | Status: DC | PRN

## 2020-05-08 MED ORDER — PROPOFOL 500 MG/50ML IV EMUL
INTRAVENOUS | Status: AC
  Filled 2020-05-08: qty 50

## 2020-05-08 MED ORDER — PROPOFOL 10 MG/ML IV BOLUS
INTRAVENOUS | Status: DC | PRN
  Administered 2020-05-08: 150 mg via INTRAVENOUS

## 2020-05-08 MED ORDER — KETOROLAC TROMETHAMINE 30 MG/ML IJ SOLN: 30.0000 mg | Freq: Once | INTRAMUSCULAR | Status: DC | PRN

## 2020-05-08 MED ORDER — SODIUM CHLORIDE 0.9 % IR SOLN
Status: DC | PRN
  Administered 2020-05-08: 3000 mL

## 2020-05-08 MED ORDER — ONDANSETRON HCL 4 MG/2ML IJ SOLN
INTRAMUSCULAR | Status: DC | PRN
  Administered 2020-05-08: 4 mg via INTRAVENOUS

## 2020-05-08 MED ORDER — DEXAMETHASONE SODIUM PHOSPHATE 10 MG/ML IJ SOLN
INTRAMUSCULAR | Status: AC
  Filled 2020-05-08: qty 1

## 2020-05-08 MED ORDER — FENTANYL CITRATE (PF) 100 MCG/2ML IJ SOLN
INTRAMUSCULAR | Status: DC | PRN
  Administered 2020-05-08 (×2): 50 ug via INTRAVENOUS

## 2020-05-08 MED ORDER — LEVONORGESTREL 20 MCG/24HR IU IUD
INTRAUTERINE_SYSTEM | Freq: Once | INTRAUTERINE | Status: AC
Start: 2020-05-08 — End: ?

## 2020-05-08 MED ORDER — DEXMEDETOMIDINE (PRECEDEX) IN NS 20 MCG/5ML (4 MCG/ML) IV SYRINGE
PREFILLED_SYRINGE | INTRAVENOUS | Status: AC
  Filled 2020-05-08: qty 5

## 2020-05-08 MED ORDER — OXYCODONE HCL 5 MG/5ML PO SOLN: 5.0000 mg | Freq: Once | ORAL | Status: DC | PRN

## 2020-05-08 MED ORDER — ONDANSETRON HCL 4 MG/2ML IJ SOLN
INTRAMUSCULAR | Status: AC
  Filled 2020-05-08: qty 2

## 2020-05-08 MED ORDER — FENTANYL CITRATE (PF) 100 MCG/2ML IJ SOLN: 25.0000 ug | INTRAMUSCULAR | Status: DC | PRN

## 2020-05-08 MED ORDER — MIDAZOLAM HCL 2 MG/2ML IJ SOLN
INTRAMUSCULAR | Status: AC
  Filled 2020-05-08: qty 2

## 2020-05-08 MED ORDER — LIDOCAINE 2% (20 MG/ML) 5 ML SYRINGE
INTRAMUSCULAR | Status: AC
  Filled 2020-05-08: qty 5

## 2020-05-08 MED ORDER — ACETAMINOPHEN 160 MG/5ML PO SOLN: 325.0000 mg | ORAL | Status: DC | PRN

## 2020-05-08 MED ORDER — LIDOCAINE 2% (20 MG/ML) 5 ML SYRINGE
INTRAMUSCULAR | Status: DC | PRN
  Administered 2020-05-08: 60 mg via INTRAVENOUS

## 2020-05-08 MED ORDER — LACTATED RINGERS IV SOLN: INTRAVENOUS | Status: DC

## 2020-05-08 MED ORDER — DEXMEDETOMIDINE (PRECEDEX) IN NS 20 MCG/5ML (4 MCG/ML) IV SYRINGE
PREFILLED_SYRINGE | INTRAVENOUS | Status: DC | PRN
  Administered 2020-05-08 (×2): 8 ug via INTRAVENOUS

## 2020-05-08 MED ORDER — DEXAMETHASONE SODIUM PHOSPHATE 4 MG/ML IJ SOLN
INTRAMUSCULAR | Status: DC | PRN
  Administered 2020-05-08: 10 mg via INTRAVENOUS

## 2020-05-08 MED ORDER — FENTANYL CITRATE (PF) 100 MCG/2ML IJ SOLN
INTRAMUSCULAR | Status: AC
  Filled 2020-05-08: qty 2

## 2020-05-08 MED ORDER — LEVONORGESTREL 20 MCG/24HR IU IUD
INTRAUTERINE_SYSTEM | INTRAUTERINE | Status: AC
  Filled 2020-05-08: qty 1

## 2020-05-08 MED ORDER — ONDANSETRON HCL 4 MG/2ML IJ SOLN: 4.0000 mg | Freq: Once | INTRAMUSCULAR | Status: DC | PRN

## 2020-05-08 SURGICAL SUPPLY — 15 items
ABLATOR SURESOUND NOVASURE (ABLATOR) ×2 IMPLANT
CANISTER SUCT 3000ML PPV (MISCELLANEOUS) ×2 IMPLANT
CATH ROBINSON RED A/P 16FR (CATHETERS) ×2 IMPLANT
COVER WAND RF STERILE (DRAPES) ×2 IMPLANT
GAUZE 4X4 16PLY RFD (DISPOSABLE) ×2 IMPLANT
GLOVE SURG ENC MOIS LTX SZ7 (GLOVE) ×2 IMPLANT
GLOVE SURG UNDER POLY LF SZ7 (GLOVE) ×2 IMPLANT
GOWN STRL REUS W/TWL LRG LVL3 (GOWN DISPOSABLE) ×2 IMPLANT
KIT PROCEDURE FLUENT (KITS) ×2 IMPLANT
KIT TURNOVER CYSTO (KITS) ×2 IMPLANT
PACK VAGINAL MINOR WOMEN LF (CUSTOM PROCEDURE TRAY) ×2 IMPLANT
PAD OB MATERNITY 4.3X12.25 (PERSONAL CARE ITEMS) ×2 IMPLANT
SEAL ROD LENS SCOPE MYOSURE (ABLATOR) ×2 IMPLANT
TOWEL OR 17X26 10 PK STRL BLUE (TOWEL DISPOSABLE) ×2 IMPLANT
mirena iud ×2 IMPLANT
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
## 2020-05-08 NOTE — Discharge Instructions (Signed)
Intrauterine Device Insertion, Care After This sheet gives you information about how to care for yourself after your procedure. Your health care provider may also give you more specific instructions. If you have problems or questions, contact your health care provider. What can I expect after the procedure? After the procedure, it is common to have:  Cramps and pain in the abdomen.  Bleeding. It may be light or heavy. This may last for a few days.  Lower back pain.  Dizziness.  Headaches.  Nausea. Follow these instructions at home:  Before resuming sexual activity, check to make sure that you can feel the IUD string or strings. You should be able to feel the end of the string below the opening of your cervix. If your IUD string is in place, you may resume sexual activity. ? If you had a hormonal IUD inserted more than 7 days after your most recent period started, you will need to use a backup method of birth control for 7 days after IUD insertion. Ask your health care provider whether this applies to you.  Continue to check that the IUD is still in place by feeling for the strings after every menstrual period, or once a month.  An IUD will not protect you from sexually transmitted infections (STIs). Use methods to prevent the exchange of body fluids between partners (barrier protection) every time you have sex. Barrier protection can be used during oral, vaginal, or anal sex. Commonly used barrier methods include: ? Female condom. ? Female condom. ? Dental dam.  Take over-the-counter and prescription medicines only as told by your health care provider.  Keep all follow-up visits as told by your health care provider. This is important.   Contact a health care provider if:  You feel light-headed or weak.  You have any of the following problems with your IUD string or strings: ? The string bothers or hurts you or your sexual partner. ? You cannot feel the string. ? The string has  gotten longer.  You can feel the IUD in your vagina.  You think you may be pregnant, or you miss your menstrual period.  You think you may have a sexually transmitted infection (STI). Get help right away if:  You have flu-like symptoms, such as tiredness (fatigue) and muscle aches.  You have a fever and chills.  You have bleeding that is heavier or lasts longer than a normal menstrual cycle.  You have abnormal or bad-smelling discharge from your vagina.  You develop abdominal pain that is new, is getting worse, or is not in the same area of earlier cramping and pain.  You have pain during sexual activity. Summary  After the procedure, it is common to have cramps and pain in the abdomen. It is also common to have light bleeding or heavier bleeding that is like your menstrual period.  Continue to check that the IUD is still in place by feeling for the strings after every menstrual period, or once a month.  Keep all follow-up visits as told by your health care provider. This is important.  Contact your health care provider if you have problems with your IUD strings, such as the string getting longer or bothering you or your sexual partner. This information is not intended to replace advice given to you by your health care provider. Make sure you discuss any questions you have with your health care provider. Document Revised: 01/25/2019 Document Reviewed: 01/25/2019 Elsevier Patient Education  2021 [employer]  Post Anesthesia Home Care Instructions  Activity: Get plenty of rest for the remainder of the day. A responsible individual must stay with you for 24 hours following the procedure.  For the next 24 hours, DO NOT: -Drive a car -Advertising copywriter -Drink alcoholic beverages -Take any medication unless instructed by your physician -Make any legal decisions or sign important papers.  Meals: Start with liquid foods such as gelatin or soup. Progress to regular foods  as tolerated. Avoid greasy, spicy, heavy foods. If nausea and/or vomiting occur, drink only clear liquids until the nausea and/or vomiting subsides. Call your physician if vomiting continues.  Special Instructions/Symptoms: Your throat may feel dry or sore from the anesthesia or the breathing tube placed in your throat during surgery. If this causes discomfort, gargle with warm salt water. The discomfort should disappear within 24 hours.      Marland Kitchen

## 2020-05-08 NOTE — Op Note (Signed)
05/08/2020  7:59 AM  PATIENT:  Jane Soto  30 y.o. female  PRE-OPERATIVE DIAGNOSIS:  Lost IUD strings  POST-OPERATIVE DIAGNOSIS:  Lost IUD strings  PROCEDURE:  Procedure(s): DILATATION AND CURETTAGE /HYSTEROSCOPY (N/A) INTRAUTERINE DEVICE (IUD) INSERTION (N/A); IUD removal; paracervical nerve block   SURGEON:  Surgeon(s) and Role:    * Willodean Rosenthal, MD - Primary  ANESTHESIA:   general  EBL:  <5cc  BLOOD ADMINISTERED:none  DRAINS: none   LOCAL MEDICATIONS USED:  MARCAINE     SPECIMEN: IUD  DISPOSITION OF SPECIMEN:  discarded  COUNTS:  YES  TOURNIQUET:  * No tourniquets in log *  DICTATION: .Note written in EPIC  PLAN OF CARE: Discharge to home after PACU  PATIENT DISPOSITION:  PACU - hemodynamically stable.   Delay start of Pharmacological VTE agent (>24hrs) due to surgical blood loss or risk of bleeding: not applicable  Complications: none immediate.    Patient identified, informed consent performed. Discussed risks of irregular bleeding, cramping, infection, malpositioning or misplacement of the IUD outside the uterus which may require further procedures.  Procedure: Time out was performed. Speculum placed in the vagina. Cervix visualized. Cleaned with Betadine x 2. Grasped anteriorly with a single tooth tenaculum. A paracervical block was placed using 0.5% Marcaine. 5cc at 4 o'clock and 5cc at 7 o' clock. The cervix was dilated to accommodate a 48mm hysteroscope. The scope was inserted under direct visualization.  The strings of the IUD were grasped with the hysteroscopic graspers and removed intact. The hysteroscope was removed. At this point, the uterus was sounded to 7 cm. Mirena IUD placed per manufacturer's recommendations. Strings trimmed to 3 cm. Tenaculum was removed, good hemostasis noted. Patient tolerated procedure well. All instruments were removed from the pts pelvis and she was awaken and taken to recovery in stable condition.    Carolyn L. Harraway-Smith, M.D., [facility]

## 2020-05-08 NOTE — Anesthesia Postprocedure Evaluation (Signed)
Anesthesia Post Note  Patient: Jane Soto  Procedure(s) Performed: DILATATION AND CURETTAGE /HYSTEROSCOPY (N/A Uterus) INTRAUTERINE DEVICE (IUD) INSERTION (N/A Uterus)     Patient location during evaluation: PACU Anesthesia Type: General Level of consciousness: awake and sedated Pain management: pain level controlled Vital Signs Assessment: post-procedure vital signs reviewed and stable Respiratory status: spontaneous breathing Cardiovascular status: stable Postop Assessment: no apparent nausea or vomiting Anesthetic complications: no   No complications documented.  Last Vitals:  Vitals:   05/08/20 0815 05/08/20 0819  BP: 100/72   Pulse: (!) 51 (!) 48  Resp: 17 14  Temp:    SpO2: 100% 100%    Last Pain:  Vitals:   05/08/20 0819  TempSrc:   PainSc: 0-No pain                 Caren Macadam

## 2020-05-08 NOTE — Anesthesia Procedure Notes (Signed)
Procedure Name: LMA Insertion Date/Time: 05/08/2020 7:39 AM Performed by: Caren Macadam, CRNA Pre-anesthesia Checklist: Patient identified, Emergency Drugs available, Suction available and Patient being monitored Patient Re-evaluated:Patient Re-evaluated prior to induction Oxygen Delivery Method: Circle system utilized Preoxygenation: Pre-oxygenation with 100% oxygen Induction Type: IV induction Ventilation: Mask ventilation without difficulty LMA: LMA inserted LMA Size: 4.0 Number of attempts: 1 Placement Confirmation: positive ETCO2 and breath sounds checked- equal and bilateral Tube secured with: Tape Dental Injury: Teeth and Oropharynx as per pre-operative assessment

## 2020-05-08 NOTE — Transfer of Care (Signed)
Immediate Anesthesia Transfer of Care Note  Patient: Jane Soto  Procedure(s) Performed: DILATATION AND CURETTAGE /HYSTEROSCOPY (N/A Uterus) INTRAUTERINE DEVICE (IUD) INSERTION (N/A Uterus)  Patient Location: PACU  Anesthesia Type:General  Level of Consciousness: drowsy  Airway & Oxygen Therapy: Patient Spontanous Breathing and Patient connected to face mask oxygen  Post-op Assessment: Report given to RN and Post -op Vital signs reviewed and stable  Post vital signs: Reviewed and stable  Last Vitals:  Vitals Value Taken Time  BP    Temp    Pulse 49 05/08/20 0804  Resp 16 05/08/20 0804  SpO2 100 % 05/08/20 0804  Vitals shown include unvalidated device data.  Last Pain:  Vitals:   05/08/20 0605  TempSrc: Oral  PainSc: 0-No pain      Patients Stated Pain Goal: 5 (05/08/20 4196)  Complications: No complications documented.

## 2020-05-08 NOTE — Brief Op Note (Signed)
05/08/2020  7:59 AM  PATIENT:  Jane Soto  30 y.o. female  PRE-OPERATIVE DIAGNOSIS:  Lost IUD strings  POST-OPERATIVE DIAGNOSIS:  Lost IUD strings  PROCEDURE:  Procedure(s): DILATATION AND CURETTAGE /HYSTEROSCOPY (N/A) INTRAUTERINE DEVICE (IUD) INSERTION (N/A); IUD removal; paracervical nerve block   SURGEON:  Surgeon(s) and Role:    * Willodean Rosenthal, MD - Primary  ANESTHESIA:   general  EBL:  <5cc  BLOOD ADMINISTERED:none  DRAINS: none   LOCAL MEDICATIONS USED:  MARCAINE     SPECIMEN: IUD  DISPOSITION OF SPECIMEN:  discarded  COUNTS:  YES  TOURNIQUET:  * No tourniquets in log *  DICTATION: .Note written in EPIC  PLAN OF CARE: Discharge to home after PACU  PATIENT DISPOSITION:  PACU - hemodynamically stable.   Delay start of Pharmacological VTE agent (>24hrs) due to surgical blood loss or risk of bleeding: not applicable  Complications: none immediate.   Chelan Heringer L. Harraway-Smith, M.D., [facility]

## 2020-05-09 ENCOUNTER — Encounter (HOSPITAL_BASED_OUTPATIENT_CLINIC_OR_DEPARTMENT_OTHER): Payer: Self-pay | Admitting: Obstetrics & Gynecology

## 2020-06-06 ENCOUNTER — Encounter: Payer: Medicaid Other | Admitting: Obstetrics & Gynecology
